# Patient Record
Sex: Female | Born: 2005 | Race: Black or African American | Hispanic: No | Marital: Single
Health system: Southern US, Community
[De-identification: ages and names within clinical notes are randomized; demographics above are authoritative.]

## PROBLEM LIST (undated history)

## (undated) DIAGNOSIS — Z789 Other specified health status: Secondary | ICD-10-CM

## (undated) DIAGNOSIS — D649 Anemia, unspecified: Secondary | ICD-10-CM

## (undated) HISTORY — DX: Other specified health status: Z78.9

## (undated) HISTORY — PX: NO PAST SURGERIES: SHX2092

## (undated) HISTORY — DX: Anemia, unspecified: D64.9

---
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
## 2023-01-25 ENCOUNTER — Ambulatory Visit (HOSPITAL_COMMUNITY)
Admission: EM | Admit: 2023-01-25 | Discharge: 2023-01-25 | Disposition: A | Discharge status: Home / Self Care | Payer: Medicaid Other | Attending: Family Medicine | Admitting: Family Medicine

## 2023-01-25 ENCOUNTER — Other Ambulatory Visit: Payer: Self-pay

## 2023-01-25 ENCOUNTER — Encounter (HOSPITAL_COMMUNITY): Payer: Self-pay | Admitting: *Deleted

## 2023-01-25 DIAGNOSIS — Z7722 Contact with and (suspected) exposure to environmental tobacco smoke (acute) (chronic): Secondary | ICD-10-CM | POA: Diagnosis not present

## 2023-01-25 DIAGNOSIS — B3731 Acute candidiasis of vulva and vagina: Secondary | ICD-10-CM | POA: Diagnosis not present

## 2023-01-25 DIAGNOSIS — Z3A00 Weeks of gestation of pregnancy not specified: Secondary | ICD-10-CM | POA: Diagnosis not present

## 2023-01-25 DIAGNOSIS — N898 Other specified noninflammatory disorders of vagina: Secondary | ICD-10-CM | POA: Diagnosis not present

## 2023-01-25 DIAGNOSIS — N390 Urinary tract infection, site not specified: Secondary | ICD-10-CM | POA: Insufficient documentation

## 2023-01-25 DIAGNOSIS — O23599 Infection of other part of genital tract in pregnancy, unspecified trimester: Secondary | ICD-10-CM | POA: Diagnosis not present

## 2023-01-25 DIAGNOSIS — Z3490 Encounter for supervision of normal pregnancy, unspecified, unspecified trimester: Secondary | ICD-10-CM | POA: Insufficient documentation

## 2023-01-25 DIAGNOSIS — Z3201 Encounter for pregnancy test, result positive: Secondary | ICD-10-CM | POA: Diagnosis not present

## 2023-01-25 DIAGNOSIS — O98819 Other maternal infectious and parasitic diseases complicating pregnancy, unspecified trimester: Secondary | ICD-10-CM | POA: Diagnosis not present

## 2023-01-25 DIAGNOSIS — O2340 Unspecified infection of urinary tract in pregnancy, unspecified trimester: Secondary | ICD-10-CM | POA: Insufficient documentation

## 2023-01-25 DIAGNOSIS — Z3A15 15 weeks gestation of pregnancy: Secondary | ICD-10-CM | POA: Diagnosis not present

## 2023-01-25 LAB — POCT URINALYSIS DIP (MANUAL ENTRY)
Bilirubin, UA: NEGATIVE
Glucose, UA: NEGATIVE mg/dL
Ketones, POC UA: NEGATIVE mg/dL
Nitrite, UA: POSITIVE — AB
Protein Ur, POC: 30 mg/dL — AB
Spec Grav, UA: 1.025 (ref 1.010–1.025)
Urobilinogen, UA: 2 U/dL — AB
pH, UA: 6 (ref 5.0–8.0)

## 2023-01-25 LAB — POCT URINE PREGNANCY: Preg Test, Ur: POSITIVE — AB

## 2023-01-25 MED ORDER — CEPHALEXIN 500 MG PO CAPS: 500.0000 mg | ORAL_CAPSULE | Freq: Three times a day (TID) | ORAL | 0 refills | Status: AC

## 2023-01-25 MED ORDER — MICONAZOLE NITRATE 2 % VA CREA: 1.0000 | TOPICAL_CREAM | Freq: Every day | VAGINAL | 0 refills | Status: DC

## 2023-01-25 MED ORDER — FLUCONAZOLE 150 MG PO TABS: ORAL_TABLET | ORAL | 0 refills | Status: DC

## 2023-01-25 NOTE — ED Triage Notes (Signed)
Pt reports Vag discharge that is white and chunky and vag irritation for 3 days.

## 2023-01-25 NOTE — ED Provider Notes (Addendum)
[facility]    CSN: [id number] Arrival date & time: 01/25/23  1236      History   Chief Complaint Chief Complaint  Patient presents with   Vaginal Discharge   Vaginitis    HPI Tiffany Kline is a 17 y.o. female.    Vaginal Discharge  Patient is here for white, chunky vaginal d/c.  Also itchy.  Going on x 3 days.  No known std exposures.  No urinary symptoms noted.  Her LMP was in July.  She did have a home positive preg test.  She has not seen an ob/gyn.        History reviewed. No pertinent past medical history.  There are no problems to display for this patient.   History reviewed. No pertinent surgical history.  OB History   No obstetric history on file.      Home Medications    Prior to Admission medications   Medication Sig Start Date End Date Taking? Authorizing Provider  Cetirizine HCl (ZYRTEC) 5 MG/5ML SYRP Take 5 mg by mouth daily as needed. For allergies   Yes [provider]  fluticasone (FLONASE) 50 MCG/ACT nasal spray Place 2 sprays into the nose daily as needed. For dry nose   Yes [provider]  triamcinolone cream (KENALOG) 0.1 % Apply 1 application topically daily as needed. For bug bites   Yes [provider]  ferrous sulfate 325 (65 FE) MG tablet Take 1 tablet (325 mg total) by mouth daily. 06/26/22   Jacalyn Lefevre, MD  lidocaine (XYLOCAINE) 2 % solution Use as directed 15 mLs in the mouth or throat as needed for mouth pain. 12/08/20   Rhys Martini, PA-C  trimethoprim-polymyxin b (POLYTRIM) ophthalmic solution Place 2 drops into the right eye every 4 (four) hours. 06/26/22   Jacalyn Lefevre, MD    Family History History reviewed. No pertinent family history.  Social History Social History   Tobacco Use   Smoking status: Passive Smoke Exposure - Never Smoker     Allergies   Shellfish allergy and Fish allergy   Review of Systems Review of Systems  Constitutional: Negative.   HENT:  Negative.    Respiratory: Negative.    Cardiovascular: Negative.   Gastrointestinal: Negative.   Genitourinary:  Positive for vaginal discharge.     Physical Exam Triage Vital Signs ED Triage Vitals  Encounter Vitals Group     BP 01/25/23 1325 96/62     Systolic BP Percentile --      Diastolic BP Percentile --      Pulse Rate 01/25/23 1325 91     Resp 01/25/23 1325 18     Temp 01/25/23 1325 98.2 F (36.8 C)     Temp src --      SpO2 01/25/23 1325 99 %     Weight --      Height --      Head Circumference --      Peak Flow --      Pain Score 01/25/23 1322 7     Pain Loc --      Pain Education --      Exclude from Growth Chart --    No data found.  Updated Vital Signs BP 96/62   Pulse 91   Temp 98.2 F (36.8 C)   Resp 18   LMP  (LMP Unknown)   SpO2 99%   Visual Acuity Right Eye Distance:   Left Eye Distance:   Bilateral Distance:  Right Eye Near:   Left Eye Near:    Bilateral Near:     Physical Exam Constitutional:      Appearance: Normal appearance.  Cardiovascular:     Rate and Rhythm: Normal rate and regular rhythm.  Pulmonary:     Effort: Pulmonary effort is normal.  Neurological:     General: No focal deficit present.     Mental Status: She is alert.  Psychiatric:        Mood and Affect: Mood normal.      UC Treatments / Results  Labs (all labs ordered are listed, but only abnormal results are displayed) Labs Reviewed  POCT URINALYSIS DIP (MANUAL ENTRY) - Abnormal; Notable for the following components:      Result Value   Blood, UA small (*)    Protein Ur, POC =30 (*)    Urobilinogen, UA 2.0 (*)    Nitrite, UA Positive (*)    Leukocytes, UA Large (3+) (*)    All other components within normal limits  POCT URINE PREGNANCY - Abnormal; Notable for the following components:   Preg Test, Ur Positive (*)    All other components within normal limits  CERVICOVAGINAL ANCILLARY ONLY   UPT positive  EKG   Radiology No results  found.  Procedures Procedures (including critical care time)  Medications Ordered in UC Medications - No data to display  Initial Impression / Assessment and Plan / UC Course  I have reviewed the triage vital signs and the nursing notes.  Pertinent labs & imaging results that were available during my care of the patient were reviewed by me and considered in my medical decision making (see chart for details).  Patient is here for vaginal discharge.  Of note, she is also pregnant.  States LMP was July, which could put her about [redacted] weeks pregnant.  As a result, will treat with topicals for now.  She is aware to follow up with ob/gyn.   Final Clinical Impressions(s) / UC Diagnoses   Final diagnoses:  Vaginal yeast infection  Vaginal discharge  Pregnancy, unspecified gestational age  Urinary tract infection without hematuria, site unspecified     Discharge Instructions      You were seen today for vaginal symptoms.  I have sent out a cream to help treat possible yeast infection.  The vaginal swab will be resulted tomorrow and if there is anything else to be treated you will be notified.  You will also see your results in mychart.  Your urine does appear to show infection.  I have sent this for culture but also sent out an antibiotic for you.  Please make an appointment with an ob/gyn.  You may call [phone] to make an appointment.     ED Prescriptions     Medication Sig Dispense Auth. Provider   fluconazole (DIFLUCAN) 150 MG tablet  (Status: Discontinued) 1 tab po x 1, repeat in 3 days 2 tablet Dot Splinter, MD   cephALEXin (KEFLEX) 500 MG capsule Take 1 capsule (500 mg total) by mouth 3 (three) times daily for 7 days. 21 capsule Aniyah Nobis, MD   miconazole (MICONAZOLE 7) 2 % vaginal cream Place 1 Applicatorful vaginally at bedtime. 45 g Jannifer Franklin, MD      PDMP not reviewed this encounter.   Jannifer Franklin, MD 01/25/23 1406    Jannifer Franklin, MD 02/02/23 [phone]

## 2023-01-25 NOTE — Discharge Instructions (Addendum)
You were seen today for vaginal symptoms.  I have sent out a cream to help treat possible yeast infection.  The vaginal swab will be resulted tomorrow and if there is anything else to be treated you will be notified.  You will also see your results in mychart.  Your urine does appear to show infection.  I have sent this for culture but also sent out an antibiotic for you.  Please make an appointment with an ob/gyn.  You may call [phone] to make an appointment.

## 2023-01-25 NOTE — ED Triage Notes (Signed)
Last period July 2024

## 2023-01-26 LAB — CERVICOVAGINAL ANCILLARY ONLY
Bacterial Vaginitis (gardnerella): NEGATIVE
Candida Glabrata: NEGATIVE
Candida Vaginitis: POSITIVE — AB
Chlamydia: POSITIVE — AB
Comment: NEGATIVE
Comment: NEGATIVE
Comment: NEGATIVE
Comment: NEGATIVE
Comment: NEGATIVE
Comment: NORMAL
Neisseria Gonorrhea: POSITIVE — AB
Trichomonas: NEGATIVE

## 2023-01-27 LAB — URINE CULTURE: Culture: 80000 — AB

## 2023-01-30 DIAGNOSIS — Z419 Encounter for procedure for purposes other than remedying health state, unspecified: Secondary | ICD-10-CM | POA: Diagnosis not present

## 2023-03-01 DIAGNOSIS — Z419 Encounter for procedure for purposes other than remedying health state, unspecified: Secondary | ICD-10-CM | POA: Diagnosis not present

## 2023-04-01 DIAGNOSIS — Z419 Encounter for procedure for purposes other than remedying health state, unspecified: Secondary | ICD-10-CM | POA: Diagnosis not present

## 2023-04-01 NOTE — L&D Delivery Note (Signed)
 [facility]N Faculty Practice Delivery Note  Tiffany Kline is a 18 y.o. G1P0000 s/p NVSB at [redacted]w[redacted]d. She was admitted for PPROM.   ROM: 11h 49m with clear fluid GBS Status: Unknown, tx Maximum Maternal Temperature: 98.5  Labor Progress: SROM at home with AROM of forebag here with progression to complete  Delivery Date/Time: 06/01/23 @ 1741 Delivery: Called to room and patient was complete and pushing. Head delivered OA to LOA. No nuchal cord present. Shoulder and body delivered in usual fashion. Infant with poor tone placed on mother's abdomen, dried and stimulated. Cord clamped x 2 and cut by CNM, left cord long for later trim by FOB. Infant taken to warmer with NICU team. Cord blood drawn. Placenta delivered spontaneously, intact, with 3-vessel cord. Fundus firm with massage and Pitocin. Labia, perineum, vagina, and cervix inspected, bilateral labial and 1st degree vaginal laceration found and repaired with 3.0 vicryl, with excellent approximation and hemostasis noted.   Placenta: spontaneous, intact, Duncan to Path Complications: preterm delivery to NICU Lacerations: bilateral labial and 1st degree vaginal, right labial with repair only as above, left labial hemostatic EBL: 222 Analgesia: epidural  Postpartum Planning Arly.Keller ] transfer orders to MB [ X] discharge summary started & shared Arly.Keller ] message to sent to schedule follow-up  Arly.Keller ] lists updated  Infant: boy Andi Hence 6/8  2110 g  Lamont Snowball, MSN, CNM, RNC-OB Certified Nurse Midwife, [facility] 06/01/2023 7:58 PM

## 2023-04-12 DIAGNOSIS — Z419 Encounter for procedure for purposes other than remedying health state, unspecified: Secondary | ICD-10-CM | POA: Diagnosis not present

## 2023-04-13 ENCOUNTER — Telehealth (INDEPENDENT_AMBULATORY_CARE_PROVIDER_SITE_OTHER): Payer: Medicaid Other

## 2023-04-13 DIAGNOSIS — O0930 Supervision of pregnancy with insufficient antenatal care, unspecified trimester: Secondary | ICD-10-CM | POA: Insufficient documentation

## 2023-04-13 DIAGNOSIS — O0933 Supervision of pregnancy with insufficient antenatal care, third trimester: Secondary | ICD-10-CM

## 2023-04-13 DIAGNOSIS — Z3A28 28 weeks gestation of pregnancy: Secondary | ICD-10-CM | POA: Diagnosis not present

## 2023-04-13 DIAGNOSIS — Z3490 Encounter for supervision of normal pregnancy, unspecified, unspecified trimester: Secondary | ICD-10-CM | POA: Insufficient documentation

## 2023-04-13 MED ORDER — GOJJI WEIGHT SCALE MISC: 1.0000 | 0 refills | Status: AC | PRN

## 2023-04-13 MED ORDER — BLOOD PRESSURE KIT DEVI: 1.0000 | 0 refills | Status: AC | PRN

## 2023-04-13 NOTE — Patient Instructions (Addendum)
 Please pick up blood pressure and weight scale from  [facility]  [address]  [place], [place] [address] [phone]   Safe Medications in Pregnancy   Acne:  Benzoyl Peroxide  Salicylic Acid   Backache/Headache:  Tylenol : 2 regular strength every 4 hours OR               2 Extra strength every 6 hours   Colds/Coughs/Allergies:  Benadryl  (alcohol free) 25 mg every 6 hours as needed  Breath right strips  Claritin  Cepacol throat lozenges  Chloraseptic throat spray  Cold-Eeze- up to three times per day  Cough drops, alcohol free  Flonase (by prescription only)  Guaifenesin  Mucinex  Robitussin DM (plain only, alcohol free)  Saline nasal spray/drops  Sudafed (pseudoephedrine) & Actifed * use only after [redacted] weeks gestation and if you do not have high blood pressure  Tylenol   Vicks Vaporub  Zinc lozenges  Zyrtec   Constipation:  Colace  Ducolax suppositories  Fleet enema  Glycerin  suppositories  Metamucil  Milk of magnesia  Miralax  Senokot  Smooth move tea   Diarrhea:  Kaopectate  Imodium A-D   *NO pepto Bismol   Hemorrhoids:  Anusol  Anusol HC  Preparation H  Tucks   Indigestion:  Tums  Maalox  Mylanta  Zantac  Pepcid   Insomnia:  Benadryl  (alcohol free) 25mg  every 6 hours as needed  Tylenol  PM  Unisom, no Gelcaps   Leg Cramps:  Tums  MagGel   Nausea/Vomiting:  Bonine  Dramamine  Emetrol  Ginger extract  Sea bands  Meclizine  Nausea medication to take during pregnancy:  Unisom (doxylamine succinate 25 mg tablets) Take one tablet daily at bedtime. If symptoms are not adequately controlled, the dose can be increased to a maximum recommended dose of two tablets daily (1/2 tablet in the morning, 1/2 tablet mid-afternoon and one at bedtime).  Vitamin B6 100mg  tablets. Take one tablet twice a day (up to 200 mg per day).   Skin Rashes:  Aveeno products  Benadryl  cream or 25mg  every 6 hours as needed  Calamine Lotion  1% cortisone  cream   Yeast infection:  Gyne-lotrimin 7  Monistat  7    **If taking multiple medications, please check labels to avoid duplicating the same active ingredients  **take medication as directed on the label  ** Do not exceed 4000 mg of tylenol  in 24 hours  **Do not take medications that contain aspirin or ibuprofen 

## 2023-04-13 NOTE — Progress Notes (Signed)
 New OB Intake  I connected with Tiffany Kline   on 04/13/23 at  3:15 PM EST by MyChart Video Visit and verified that I am speaking with the correct person using two identifiers. Nurse is located at [facility] and pt is located at home.  I discussed the limitations, risks, security and privacy concerns of performing an evaluation and management service by telephone and the availability of in person appointments. I also discussed with the patient that there may be a patient responsible charge related to this service. The patient expressed understanding and agreed to proceed.  I explained I am completing New OB Intake today. We discussed EDD of 07/06/2023 based on LMP. Pt is G1P0. I reviewed her allergies, medications and Medical/Surgical/OB history.    Patient Active Problem List   Diagnosis Date Noted   Supervision of low-risk pregnancy 04/13/2023   Late prenatal care 04/13/2023    Concerns addressed today  No concerns today  Delivery Plans Plans to deliver at [facility]. Discussed the nature of our practice with multiple providers including residents and students. Due to the size of the practice, the delivering provider may not be the same as those providing prenatal care.   Patient is not interested in water birth. Offered upcoming OB visit with CNM to discuss further.  MyChart/Babyscripts MyChart access verified. I explained pt will have some visits in office and some virtually. Babyscripts instructions given and order placed. Patient verifies receipt of registration text/e-mail. Account successfully created and app downloaded. If patient is a candidate for Optimized scheduling, add to sticky note.   Blood Pressure Cuff/Weight Scale Blood pressure cuff ordered for patient to pick-up from [facility]. Explained after first prenatal appt pt will check weekly and document in Babyscripts. Patient does not have weight scale; order sent to [facility], patient may track weight weekly in  Babyscripts.  Anatomy US  Explained first scheduled US  will be around 19 weeks. Anatomy US  scheduled for January 31 at 1415.  Is patient a CenteringPregnancy candidate?  Is thinking about joining  and will let us  know at her NEW OB provider appt.    Is patient a Mom+Baby Combined Care candidate?  Not a candidate - she is late to care.    Interested in [place]? If yes, send referral and doula dot phrase.   Is patient a candidate for Babyscripts Optimization? Yes, patient declined   First visit review I reviewed new OB appt with patient. Explained pt will be seen by Izell, MD at first visit. Discussed [place] genetic screening with patient. Pt agrees to  [employer] and [employer].. Routine prenatal labs along with 2 hr gtt  will be collected  at her NEW OB visit on 04/20/23.  Pt aware to have nothing by mouth for glucose testing.   Orders placed as future order.   Last Pap Patient is not of age for pap smear and will get GC/CH vaginal swab at NEW OB provider appt.   Sybrina Laning, RN 04/13/2023  3:31 PM

## 2023-04-15 DIAGNOSIS — Z3490 Encounter for supervision of normal pregnancy, unspecified, unspecified trimester: Secondary | ICD-10-CM | POA: Diagnosis not present

## 2023-04-20 ENCOUNTER — Ambulatory Visit: Payer: Medicaid Other | Admitting: Obstetrics and Gynecology

## 2023-04-20 ENCOUNTER — Other Ambulatory Visit (HOSPITAL_COMMUNITY)
Admission: RE | Admit: 2023-04-20 | Discharge: 2023-04-20 | Disposition: A | Discharge status: Home / Self Care | Payer: Medicaid Other | Source: Ambulatory Visit | Attending: Obstetrics and Gynecology | Admitting: Obstetrics and Gynecology

## 2023-04-20 ENCOUNTER — Encounter: Payer: Self-pay | Admitting: Obstetrics and Gynecology

## 2023-04-20 ENCOUNTER — Other Ambulatory Visit: Payer: Self-pay

## 2023-04-20 ENCOUNTER — Other Ambulatory Visit: Payer: Medicaid Other

## 2023-04-20 VITALS — BP 112/72 | HR 103 | Wt 120.0 lb

## 2023-04-20 DIAGNOSIS — Z3482 Encounter for supervision of other normal pregnancy, second trimester: Secondary | ICD-10-CM | POA: Diagnosis not present

## 2023-04-20 DIAGNOSIS — Z3490 Encounter for supervision of normal pregnancy, unspecified, unspecified trimester: Secondary | ICD-10-CM

## 2023-04-20 DIAGNOSIS — A749 Chlamydial infection, unspecified: Secondary | ICD-10-CM | POA: Diagnosis not present

## 2023-04-20 DIAGNOSIS — Z3A29 29 weeks gestation of pregnancy: Secondary | ICD-10-CM | POA: Diagnosis not present

## 2023-04-20 DIAGNOSIS — Z23 Encounter for immunization: Secondary | ICD-10-CM

## 2023-04-20 DIAGNOSIS — O0930 Supervision of pregnancy with insufficient antenatal care, unspecified trimester: Secondary | ICD-10-CM

## 2023-04-20 DIAGNOSIS — O0933 Supervision of pregnancy with insufficient antenatal care, third trimester: Secondary | ICD-10-CM | POA: Diagnosis not present

## 2023-04-20 DIAGNOSIS — O98813 Other maternal infectious and parasitic diseases complicating pregnancy, third trimester: Secondary | ICD-10-CM | POA: Diagnosis not present

## 2023-04-20 MED ORDER — PRENATAL PLUS VITAMIN/MINERAL 27-1 MG PO TABS: 1.0000 | ORAL_TABLET | Freq: Every day | ORAL | 11 refills | Status: AC

## 2023-04-20 NOTE — Progress Notes (Addendum)
New OB Note  04/20/2023   Clinic: [facility]  Chief Complaint: new OB  Transfer of Care Patient: no  History of Present Illness: Tiffany Kline is a 18 y.o. G1P0000 at 29/0 weeks [facility] 4/7 [tenative], based on Patient's last menstrual period was 09/29/2022.).  Preg complicated by has Supervision of low-risk pregnancy and Late prenatal care on their problem list.   No OB s/s.   ROS: A 12-point review of systems was performed and negative, except as stated in the above HPI.  OBGYN History: As per HPI. OB History  Gravida Para Term Preterm AB Living  1 0 0 0 0 0  SAB IAB Ectopic Multiple Live Births  0 0 0 0 0    # Outcome Date GA Lbr Len/2nd Weight Sex Type Anes PTL Lv  1 Current            History of pap smears: No.    Past Medical History: Past Medical History:  Diagnosis Date   No pertinent past medical history     Past Surgical History: No past surgical history on file.  Family History:  Family History  Problem Relation Age of Onset   Kidney failure Mother    Social History:  Social History   Socioeconomic History   Marital status: Single    Spouse name: Not on file   Number of children: Not on file   Years of education: Not on file   Highest education level: Not on file  Occupational History   Not on file  Tobacco Use   Smoking status: Never    Passive exposure: Yes   Smokeless tobacco: Not on file  Vaping Use   Vaping status: Former   Quit date: 11/30/2022  Substance and Sexual Activity   Alcohol use: Never   Drug use: Never   Sexual activity: Yes    Birth control/protection: None  Other Topics Concern   Not on file  Social History Narrative   Not on file   Social Drivers of Health   Financial Resource Strain: Not on file  Food Insecurity: Not on file  Transportation Needs: Not on file  Physical Activity: Not on file  Stress: Not on file  Social Connections: Not on file  Intimate Partner Violence: Not  on file    Allergy: Allergies  Allergen Reactions   Shellfish Allergy Anaphylaxis   Fish Allergy Hives and Rash    "shrimp"   Current Outpatient Medications: Prenatal vitamin  Physical Exam:   BP 112/72   Pulse (!) 103   Wt 120 lb (54.4 kg)   LMP 09/29/2022   BMI 19.37 kg/m  Body mass index is 19.37 kg/m. Contractions: Not present Vag. Bleeding: None. Fundal height: 26 FHTs: 140s  General appearance: Well nourished, well developed female in no acute distress.  Neck:  Supple, normal appearance, and no thyromegaly  Cardiovascular: S1, S2 normal, no murmur, rub or gallop, regular rate and rhythm Respiratory:  Clear to auscultation bilateral. Normal respiratory effort Abdomen: gravid, nttp Neuro/Psych:  Normal mood and affect.  Skin:  Warm and dry.   Laboratory: none  Imaging:  none  Assessment: patient stable  Plan: 1. Encounter for supervision of low-risk pregnancy, antepartum (Primary) Patient states her periods were qmonth, regular and she wasn't on anything for contraception when she conceived. She also denies any u/s anywhere this pregnancy. Confirm EDC after u/s. FH borderline low so will see if can move up anatomy/dating u/s from 1/31. D/w her  re: total weight gain goals [facility] precautions given.  New OB labs, panorama, horizon, GTT today - GC/Chlamydia probe amp ([place])not at [facility] - Flu vaccine trivalent PF, 6mos and older(Flulaval,Afluria,Fluarix,Fluzone)  2. Encounter for supervision of other normal pregnancy in second trimester - HORIZON Basic Panel - PANORAMA PRENATAL TEST  3. Late prenatal care Pregnancy navigator not here today; follow up next visit  4. [redacted] weeks gestation of pregnancy  Problem list reviewed and updated.  Follow up in 2 weeks.  >50% of 35 min visit spent on counseling and coordination of care.  Return in 11 days (on 05/01/2023) for md or app, low risk ob, in person.  Future Appointments  Date Time Provider Department  Center  05/01/2023  2:15 PM [facility] NURSE [facility]  05/01/2023  2:30 PM [facility] US2 [facility]  05/01/2023  3:30 PM [facility] PROVIDER 1 [facility]    Cornelia Copa MD Attending Center for [facility])

## 2023-04-20 NOTE — Patient Instructions (Signed)

## 2023-04-21 LAB — CBC/D/PLT+RPR+RH+ABO+RUBIGG...
Antibody Screen: NEGATIVE
Basophils Absolute: 0.1 10*3/uL (ref 0.0–0.2)
Basos: 1 %
EOS (ABSOLUTE): 0.1 10*3/uL (ref 0.0–0.4)
Eos: 1 %
HCV Ab: NONREACTIVE
HIV Screen 4th Generation wRfx: NONREACTIVE
Hematocrit: 26.8 % — ABNORMAL LOW (ref 34.0–46.6)
Hemoglobin: 8.5 g/dL — ABNORMAL LOW (ref 11.1–15.9)
Hepatitis B Surface Ag: NEGATIVE
Immature Grans (Abs): 0.1 10*3/uL (ref 0.0–0.1)
Immature Granulocytes: 1 %
Lymphocytes Absolute: 2.1 10*3/uL (ref 0.7–3.1)
Lymphs: 20 %
MCH: 25.2 pg — ABNORMAL LOW (ref 26.6–33.0)
MCHC: 31.7 g/dL (ref 31.5–35.7)
MCV: 80 fL (ref 79–97)
Monocytes Absolute: 0.9 10*3/uL (ref 0.1–0.9)
Monocytes: 9 %
Neutrophils Absolute: 7.4 10*3/uL — ABNORMAL HIGH (ref 1.4–7.0)
Neutrophils: 68 %
Platelets: 212 10*3/uL (ref 150–450)
RBC: 3.37 x10E6/uL — ABNORMAL LOW (ref 3.77–5.28)
RDW: 13.4 % (ref 11.7–15.4)
RPR Ser Ql: NONREACTIVE
Rh Factor: POSITIVE
Rubella Antibodies, IGG: 2.75 {index} (ref >0.99)
WBC: 10.6 10*3/uL (ref 3.4–10.8)

## 2023-04-21 LAB — GC/CHLAMYDIA PROBE AMP (~~LOC~~) NOT AT ARMC
Chlamydia: POSITIVE — AB
Comment: NEGATIVE
Comment: NORMAL
Neisseria Gonorrhea: NEGATIVE

## 2023-04-21 LAB — GLUCOSE TOLERANCE, 2 HOURS W/ 1HR
Glucose, 1 hour: 140 mg/dL (ref 70–179)
Glucose, 2 hour: 100 mg/dL (ref 70–152)
Glucose, Fasting: 79 mg/dL (ref 70–91)

## 2023-04-21 LAB — HEMOGLOBIN A1C
Est. average glucose Bld gHb Est-mCnc: 100 mg/dL
Hgb A1c MFr Bld: 5.1 % (ref 4.8–5.6)

## 2023-04-21 LAB — HCV INTERPRETATION

## 2023-04-22 ENCOUNTER — Encounter: Payer: Self-pay | Admitting: Obstetrics and Gynecology

## 2023-04-22 ENCOUNTER — Other Ambulatory Visit: Payer: Self-pay | Admitting: Obstetrics and Gynecology

## 2023-04-22 ENCOUNTER — Telehealth: Payer: Self-pay | Admitting: Pharmacy Technician

## 2023-04-22 DIAGNOSIS — A749 Chlamydial infection, unspecified: Secondary | ICD-10-CM | POA: Insufficient documentation

## 2023-04-22 DIAGNOSIS — O99019 Anemia complicating pregnancy, unspecified trimester: Secondary | ICD-10-CM | POA: Insufficient documentation

## 2023-04-22 DIAGNOSIS — Z3493 Encounter for supervision of normal pregnancy, unspecified, third trimester: Secondary | ICD-10-CM

## 2023-04-22 MED ORDER — AZITHROMYCIN 500 MG PO TABS: 1000.0000 mg | ORAL_TABLET | Freq: Once | ORAL | 0 refills | Status: AC

## 2023-04-22 NOTE — Addendum Note (Signed)
Addended by: [place] Bing on: 04/22/2023 10:52 AM   Modules accepted: Orders

## 2023-04-22 NOTE — Telephone Encounter (Signed)
Dr. Vergie Living, Patient will be scheduled as soon as possible.  Auth Submission: NO AUTH NEEDED Site of care: Site of care: [facility] Payer: [facility] Medication & CPT/J Code(s) submitted: Venofer (Iron Sucrose) J1756 Route of submission (phone, fax, portal):  Phone # Fax # Auth type: Buy/Bill PB Units/visits requested: 2 DOSES Reference number:  Approval from: 04/22/23 to 09/20/23

## 2023-04-23 ENCOUNTER — Encounter: Payer: Self-pay | Admitting: *Deleted

## 2023-04-23 LAB — URINE CULTURE, OB REFLEX

## 2023-04-23 LAB — CULTURE, OB URINE

## 2023-04-24 ENCOUNTER — Other Ambulatory Visit: Payer: Self-pay | Admitting: Obstetrics and Gynecology

## 2023-04-24 ENCOUNTER — Encounter: Payer: Self-pay | Admitting: Obstetrics and Gynecology

## 2023-04-24 DIAGNOSIS — O2343 Unspecified infection of urinary tract in pregnancy, third trimester: Secondary | ICD-10-CM | POA: Insufficient documentation

## 2023-04-24 MED ORDER — SULFAMETHOXAZOLE-TRIMETHOPRIM 800-160 MG PO TABS: 1.0000 | ORAL_TABLET | Freq: Two times a day (BID) | ORAL | 0 refills | Status: AC

## 2023-04-25 LAB — PANORAMA PRENATAL TEST FULL PANEL:PANORAMA TEST PLUS 5 ADDITIONAL MICRODELETIONS: FETAL FRACTION: 16.8

## 2023-04-29 LAB — HORIZON CUSTOM: REPORT SUMMARY: NEGATIVE

## 2023-05-01 ENCOUNTER — Ambulatory Visit (INDEPENDENT_AMBULATORY_CARE_PROVIDER_SITE_OTHER): Payer: Medicaid Other | Admitting: Obstetrics and Gynecology

## 2023-05-01 ENCOUNTER — Ambulatory Visit (HOSPITAL_BASED_OUTPATIENT_CLINIC_OR_DEPARTMENT_OTHER): Payer: Medicaid Other | Admitting: Maternal & Fetal Medicine

## 2023-05-01 ENCOUNTER — Ambulatory Visit: Payer: Medicaid Other | Admitting: *Deleted

## 2023-05-01 ENCOUNTER — Other Ambulatory Visit: Payer: Self-pay | Admitting: *Deleted

## 2023-05-01 ENCOUNTER — Ambulatory Visit: Payer: Medicaid Other | Attending: Obstetrics and Gynecology

## 2023-05-01 ENCOUNTER — Other Ambulatory Visit: Payer: Self-pay

## 2023-05-01 VITALS — BP 116/61 | HR 84

## 2023-05-01 VITALS — BP 116/75 | HR 85 | Wt 121.3 lb

## 2023-05-01 DIAGNOSIS — O0933 Supervision of pregnancy with insufficient antenatal care, third trimester: Secondary | ICD-10-CM

## 2023-05-01 DIAGNOSIS — O403XX0 Polyhydramnios, third trimester, not applicable or unspecified: Secondary | ICD-10-CM

## 2023-05-01 DIAGNOSIS — O98813 Other maternal infectious and parasitic diseases complicating pregnancy, third trimester: Secondary | ICD-10-CM

## 2023-05-01 DIAGNOSIS — O0930 Supervision of pregnancy with insufficient antenatal care, unspecified trimester: Secondary | ICD-10-CM

## 2023-05-01 DIAGNOSIS — O99013 Anemia complicating pregnancy, third trimester: Secondary | ICD-10-CM | POA: Insufficient documentation

## 2023-05-01 DIAGNOSIS — O2343 Unspecified infection of urinary tract in pregnancy, third trimester: Secondary | ICD-10-CM | POA: Diagnosis not present

## 2023-05-01 DIAGNOSIS — O409XX0 Polyhydramnios, unspecified trimester, not applicable or unspecified: Secondary | ICD-10-CM

## 2023-05-01 DIAGNOSIS — Z363 Encounter for antenatal screening for malformations: Secondary | ICD-10-CM | POA: Insufficient documentation

## 2023-05-01 DIAGNOSIS — A749 Chlamydial infection, unspecified: Secondary | ICD-10-CM

## 2023-05-01 DIAGNOSIS — Z3A30 30 weeks gestation of pregnancy: Secondary | ICD-10-CM | POA: Insufficient documentation

## 2023-05-01 DIAGNOSIS — D649 Anemia, unspecified: Secondary | ICD-10-CM | POA: Diagnosis not present

## 2023-05-01 DIAGNOSIS — Z3490 Encounter for supervision of normal pregnancy, unspecified, unspecified trimester: Secondary | ICD-10-CM | POA: Diagnosis present

## 2023-05-01 DIAGNOSIS — Z3493 Encounter for supervision of normal pregnancy, unspecified, third trimester: Secondary | ICD-10-CM

## 2023-05-01 NOTE — Progress Notes (Signed)
   PRENATAL VISIT NOTE  Subjective:  Tiffany Kline is a 18 y.o. G1P0000 at [redacted]w[redacted]d being seen today for ongoing prenatal care.  She is currently monitored for the following issues for this low-risk pregnancy and has Supervision of low-risk pregnancy; Late prenatal care; Chlamydia infection affecting pregnancy in third trimester; Anemia in pregnancy; and UTI in pregnancy, antepartum, third trimester on their problem list.  Patient reports no complaints.  Contractions: Not present. Vag. Bleeding: None.  Movement: Present. Denies leaking of fluid.   The following portions of the patient's history were reviewed and updated as appropriate: allergies, current medications, past family history, past medical history, past social history, past surgical history and problem list.   Objective:   Vitals:   05/01/23 1001  BP: 116/75  Pulse: 85  Weight: 121 lb 4.8 oz (55 kg)    Fetal Status: Fetal Heart Rate (bpm): 147 Fundal Height: 28 cm Movement: Present     General:  Alert, oriented and cooperative. Patient is in no acute distress.  Skin: Skin is warm and dry. No rash noted.   Cardiovascular: Normal heart rate noted  Respiratory: Normal respiratory effort, no problems with respiration noted  Abdomen: Soft, gravid, appropriate for gestational age.  Pain/Pressure: Present     Pelvic: Cervical exam deferred        Extremities: Normal range of motion.  Edema: None  Mental Status: Normal mood and affect. Normal behavior. Normal judgment and thought content.   Assessment and Plan:  Pregnancy: G1P0000 at [redacted]w[redacted]d 1. Encounter for supervision of low-risk pregnancy in third trimester (Primary) BP and FHR normal Doing well, feeling regular movement  FH appropriate  2. Late prenatal care   3. Chlamydia infection affecting pregnancy in third trimester S/p azithro, TOC late feb  4. [redacted] weeks gestation of pregnancy Anatomy scan today  5. UTI in pregnancy, antepartum, third trimester S/p bactrim,  TOC later   6. Anemia during pregnancy in third trimester IV iron orders placed previously, she is going to call to schedule appt    Preterm labor symptoms and general obstetric precautions including but not limited to vaginal bleeding, contractions, leaking of fluid and fetal movement were reviewed in detail with the patient. Please refer to After Visit Summary for other counseling recommendations.   Return in about 2 weeks (around 05/15/2023) for OB VISIT (MD or APP).  Future Appointments  Date Time Provider Department Center  05/01/2023  2:15 PM [facility] NURSE [facility]  05/01/2023  2:30 PM [facility] US2 [facility]  05/01/2023  3:30 PM [facility] PROVIDER 1 [facility]    Albertine Grates, FNP

## 2023-05-01 NOTE — Progress Notes (Signed)
Patient information  Patient Name: Tiffany Kline  Patient MRN:   [id number]  Referring practice: MFM Referring Provider: [facility])  MFM CONSULT  Tiffany Kline is a 18 y.o. G1P0000 at [redacted]w[redacted]d here for ultrasound and consultation. Patient Active Problem List   Diagnosis Date Noted   UTI in pregnancy, antepartum, third trimester 04/24/2023   Chlamydia infection affecting pregnancy in third trimester 04/22/2023   Anemia in pregnancy 04/22/2023   Supervision of low-risk pregnancy 04/13/2023   Late prenatal care 04/13/2023    Tiffany Kline is doing well today with no acute concerns. She denies contractions, bleeding, or loss of fluid and reports good fetal movement.   RE new EDD: LMP was uncertain. Wil change to EDD of 07/10/23.   RE polyhydramnios:  Polyhydramnios was seen on today's ultrasound based on the MVP of 15 cm. There cardiac anatomy appears normal. There is no evidence of chorioangioma. The fetal movement is normal without evidence of arthrogryposis. The fetal stomach and bladder appears normal. The fetal palate (although limited in views) and neck appears to be normal without evidence of mass. There is no evidence of of lower spine or pelvic abnormalities. She passed her GTT. I discussed the diagnosis, management, prognosis and clinical implications of polyhydramnios and the clinical implications during pregnancy. Amniotic fluid is made from the fetal kidneys and at the normal fluid ranges from 5 to 25 cm on amniotic fluid index.  Potential causes of elevated amniotic fluid in pregnancy. I discussed that most the time the cause is unknown but the most common cause that is identifiable is maternal diabetes.  Also discussed that there is a potential for birth defects or neurologic conditions that can result in elevated amniotic fluid. Potential complications associated with polyhydramnios include malpresentation, increased risk of cord prolapse,  increased discomfort during the end of pregnancy, increased risk of preterm contractions and increased risk of stillbirth.  In particular the risk of fetal anomalies and neonatal abnormalities increases with the degree of polyhydramnios.  Approximately 1% of newborns and 5 to 10% of fetuses with mild polyhydramnios will have an abnormality (tracheoesophageal fistula are among the most common abnormalities associated with polyhydramnios).  This increases to 2-5 times a risk with moderate and severe polyhydramnios.  Currently there is no treatment of polyhydramnios if it is idiopathic or due to fetal abnormalities.     Sonographic findings Single intrauterine pregnancy at 30w 0d  Fetal cardiac activity:  Observed and appears normal. Presentation: Cephalic. The anatomic structures that were well seen appear normal without evidence of soft markers. Due to poor acoustic windows some structures remain suboptimally visualized. Fetal biometry shows the estimated fetal weight at the 22 percentile.  Amniotic fluid: Polyhydramnios.  MVP: 15.07 cm. Placenta: Posterior Fundal. Adnexa: No adnexal mass visualized. Cervical length:  n/a  There are limitations of prenatal ultrasound such as the inability to detect certain abnormalities due to poor visualization. Various factors such as fetal position, gestational age and maternal body habitus may increase the difficulty in visualizing the fetal anatomy.    Recommendations -EDD is Estimated Date of Delivery: 07/10/23 based on today's [place]. -Detailed ultrasound was done today without abnormalities. -F/u growth ultrasound in 3 weeks due to new due date. -Notify pediatric team regarding the moderate polyhydramnios.  There is a approximately a 5 to 10% chance of a birth defect causing excess fluid in the setting of idiopathic polyhydramnios of this degree. -Continue routine prenatal care with referring OB provider.  Review of Systems: A review of systems was performed  and was negative except per HPI   Vitals and Physical Exam    05/01/2023    2:06 PM 05/01/2023   10:01 AM 04/20/2023    9:59 AM  Vitals with BMI  Weight  121 lbs 5 oz 120 lbs  Systolic 116 116 161  Diastolic 61 75 72  Pulse 84 85 103    Sitting comfortably on the sonogram table Nonlabored breathing Normal rate and rhythm Abdomen is nontender  Past pregnancies OB History  Gravida Para Term Preterm AB Living  1 0 0 0 0 0  SAB IAB Ectopic Multiple Live Births  0 0 0 0 0    # Outcome Date GA Lbr Len/2nd Weight Sex Type Anes PTL Lv  1 Current             I spent 30 minutes reviewing the patients chart, including labs and images as well as counseling the patient about her medical conditions. Greater than 50% of the time was spent in direct face-to-face patient counseling.  Braxton Feathers  MFM, [facility]   05/01/2023  3:07 PM

## 2023-05-02 DIAGNOSIS — Z419 Encounter for procedure for purposes other than remedying health state, unspecified: Secondary | ICD-10-CM | POA: Diagnosis not present

## 2023-05-15 ENCOUNTER — Other Ambulatory Visit: Payer: Self-pay

## 2023-05-15 ENCOUNTER — Ambulatory Visit: Payer: Medicaid Other | Admitting: Obstetrics and Gynecology

## 2023-05-15 VITALS — BP 117/76 | HR 90 | Wt 129.9 lb

## 2023-05-15 DIAGNOSIS — O0930 Supervision of pregnancy with insufficient antenatal care, unspecified trimester: Secondary | ICD-10-CM

## 2023-05-15 DIAGNOSIS — Z3493 Encounter for supervision of normal pregnancy, unspecified, third trimester: Secondary | ICD-10-CM

## 2023-05-15 DIAGNOSIS — O2343 Unspecified infection of urinary tract in pregnancy, third trimester: Secondary | ICD-10-CM | POA: Diagnosis not present

## 2023-05-15 DIAGNOSIS — O99013 Anemia complicating pregnancy, third trimester: Secondary | ICD-10-CM

## 2023-05-15 DIAGNOSIS — O98813 Other maternal infectious and parasitic diseases complicating pregnancy, third trimester: Secondary | ICD-10-CM

## 2023-05-15 DIAGNOSIS — O409XX0 Polyhydramnios, unspecified trimester, not applicable or unspecified: Secondary | ICD-10-CM

## 2023-05-15 DIAGNOSIS — Z3A32 32 weeks gestation of pregnancy: Secondary | ICD-10-CM

## 2023-05-15 DIAGNOSIS — A749 Chlamydial infection, unspecified: Secondary | ICD-10-CM

## 2023-05-15 NOTE — Progress Notes (Signed)
   PRENATAL VISIT NOTE  Subjective:  Tiffany Kline is a 18 y.o. G1P0000 at [redacted]w[redacted]d being seen today for ongoing prenatal care.  She is currently monitored for the following issues for this low-risk pregnancy and has Supervision of low-risk pregnancy; Late prenatal care; Chlamydia infection affecting pregnancy in third trimester; Anemia in pregnancy; UTI in pregnancy, antepartum, third trimester; and Polyhydramnios affecting pregnancy on their problem list.  Patient reports no complaints.  Contractions: Not present. Vag. Bleeding: None.  Movement: Present. Denies leaking of fluid.   The following portions of the patient's history were reviewed and updated as appropriate: allergies, current medications, past family history, past medical history, past social history, past surgical history and problem list.   Objective:   Vitals:   05/15/23 1110  BP: 117/76  Pulse: 90  Weight: 129 lb 14.4 oz (58.9 kg)    Fetal Status: Fetal Heart Rate (bpm): 139   Movement: Present     General:  Alert, oriented and cooperative. Patient is in no acute distress.  Skin: Skin is warm and dry. No rash noted.   Cardiovascular: Normal heart rate noted  Respiratory: Normal respiratory effort, no problems with respiration noted  Abdomen: Soft, gravid, appropriate for gestational age.  Pain/Pressure: Present     Pelvic: Cervical exam deferred        Extremities: Normal range of motion.  Edema: None  Mental Status: Normal mood and affect. Normal behavior. Normal judgment and thought content.   Assessment and Plan:  Pregnancy: G1P0000 at [redacted]w[redacted]d 1. Encounter for supervision of low-risk pregnancy in third trimester (Primary) BP and FHR normal Doing well, feeling regular movement   2. Late prenatal care   3. UTI in pregnancy, antepartum, third trimester To check urine culture later visit   4. Chlamydia infection affecting pregnancy in third trimester Completed abx, TOC next visit   5. [redacted] weeks gestation of  pregnancy Still considering pediatrician  6. Polyhydramnios affecting pregnancy 1/31 u/s AFI 31,  MVP 15 EFW 22% Follow up 2/21  7. Anemia during pregnancy in third trimester Hgb 8.5, discussed symptoms that can come with anemia, and recommendations on why we encourage iv iron She is going to call the number to schedule    Preterm labor symptoms and general obstetric precautions including but not limited to vaginal bleeding, contractions, leaking of fluid and fetal movement were reviewed in detail with the patient. Please refer to After Visit Summary for other counseling recommendations.   Return in about 2 weeks (around 05/29/2023) for OB VISIT (MD or APP).  Future Appointments  Date Time Provider Department Center  05/22/2023  7:30 AM [facility] US1 [facility]    Albertine Grates, FNP

## 2023-05-18 ENCOUNTER — Encounter: Payer: Self-pay | Admitting: Obstetrics and Gynecology

## 2023-05-18 NOTE — Progress Notes (Signed)
 Wyvonne Lenz, RN  [place] Bing, MD Cc: Desma Mcgregor, [facility]; Demetrius Charity, [facility] Good afternoon Dr. Vergie Living,  We just wanted to let you know that we have made multiple attempts to reach this patient since 04/23/23 regarding the iron infusions that you had ordered, but the patient has not returned our messages. We will continue to reach out, but wanted you to be aware.  Thank you, Denny Peon, RN  [facility] [address] [place] [place], [place] [address] [phone]

## 2023-05-22 ENCOUNTER — Other Ambulatory Visit: Payer: Self-pay

## 2023-05-22 ENCOUNTER — Ambulatory Visit: Payer: Medicaid Other | Attending: Maternal & Fetal Medicine

## 2023-05-22 ENCOUNTER — Other Ambulatory Visit: Payer: Self-pay | Admitting: Maternal & Fetal Medicine

## 2023-05-22 ENCOUNTER — Other Ambulatory Visit: Payer: Self-pay | Admitting: *Deleted

## 2023-05-22 DIAGNOSIS — O403XX0 Polyhydramnios, third trimester, not applicable or unspecified: Secondary | ICD-10-CM | POA: Diagnosis not present

## 2023-05-22 DIAGNOSIS — O99013 Anemia complicating pregnancy, third trimester: Secondary | ICD-10-CM | POA: Diagnosis not present

## 2023-05-22 DIAGNOSIS — O409XX0 Polyhydramnios, unspecified trimester, not applicable or unspecified: Secondary | ICD-10-CM

## 2023-05-22 DIAGNOSIS — O0933 Supervision of pregnancy with insufficient antenatal care, third trimester: Secondary | ICD-10-CM

## 2023-05-22 DIAGNOSIS — Z3A33 33 weeks gestation of pregnancy: Secondary | ICD-10-CM | POA: Diagnosis not present

## 2023-05-22 DIAGNOSIS — D649 Anemia, unspecified: Secondary | ICD-10-CM | POA: Diagnosis not present

## 2023-05-29 ENCOUNTER — Ambulatory Visit: Payer: Medicaid Other | Attending: Obstetrics and Gynecology | Admitting: *Deleted

## 2023-05-29 ENCOUNTER — Other Ambulatory Visit: Payer: Self-pay

## 2023-05-29 VITALS — BP 125/65

## 2023-05-29 DIAGNOSIS — Z3A34 34 weeks gestation of pregnancy: Secondary | ICD-10-CM | POA: Insufficient documentation

## 2023-05-29 DIAGNOSIS — O403XX0 Polyhydramnios, third trimester, not applicable or unspecified: Secondary | ICD-10-CM | POA: Diagnosis not present

## 2023-05-29 NOTE — Procedures (Signed)
 Marilla Thurmond 2005-10-01 [redacted]w[redacted]d  Fetus A Non-Stress Test Interpretation for 05/29/23 (NST only)  Indication: Polyhydramnios  Fetal Heart Rate A Mode: External Baseline Rate (A): 145 bpm Variability: Moderate Accelerations: 15 x 15 Decelerations: None Multiple birth?: No  Uterine Activity Mode: Palpation, Toco Contraction Frequency (min): Irreg w/erratic UI Contraction Duration (sec): 10-30 Contraction Quality: Mild Resting Tone Palpated: Relaxed Resting Time: Adequate  Interpretation (Fetal Testing) Nonstress Test Interpretation: Reactive Comments: Dr. Parke Poisson reviewed tracing.

## 2023-05-30 DIAGNOSIS — Z419 Encounter for procedure for purposes other than remedying health state, unspecified: Secondary | ICD-10-CM | POA: Diagnosis not present

## 2023-05-31 NOTE — Progress Notes (Unsigned)
   PRENATAL VISIT NOTE  Subjective:  Tiffany Kline is a 18 y.o. G1P0000 at [redacted]w[redacted]d being seen today for ongoing prenatal care.  She is currently monitored for the following issues for this {Blank single:19197::"high-risk","low-risk"} pregnancy and has Supervision of low-risk pregnancy; Late prenatal care; Chlamydia infection affecting pregnancy in third trimester; Anemia in pregnancy; UTI in pregnancy, antepartum, third trimester; and Polyhydramnios affecting pregnancy on their problem list.  Patient reports {sx:14538}.   .  .   . Denies leaking of fluid.   The following portions of the patient's history were reviewed and updated as appropriate: allergies, current medications, past family history, past medical history, past social history, past surgical history and problem list.   Objective:  There were no vitals filed for this visit.  Fetal Status:           General:  Alert, oriented and cooperative. Patient is in no acute distress.  Skin: Skin is warm and dry. No rash noted.   Cardiovascular: Normal heart rate noted  Respiratory: Normal respiratory effort, no problems with respiration noted  Abdomen: Soft, gravid, appropriate for gestational age.        Pelvic: {Blank single:19197::"Cervical exam performed in the presence of a chaperone","Cervical exam deferred"}        Extremities: Normal range of motion.     Mental Status: Normal mood and affect. Normal behavior. Normal judgment and thought content.   Assessment and Plan:  Pregnancy: G1P0000 at 103w2d 1. Encounter for supervision of low-risk pregnancy in third trimester (Primary)   2. Late prenatal care   3. UTI in pregnancy, antepartum, third trimester TOC today  4. Chlamydia infection affecting pregnancy in third trimester TOC today  5. Polyhydramnios affecting pregnancy 2/21 u/s 32.14cm, MVP: 9.57 cm.  BPP 8/8 To start weekly testing 3/21 continue growth q 4-6 wks Delivery around 39 weeks   6. [redacted] weeks gestation of  pregnancy   {Blank single:19197::"Term","Preterm"} labor symptoms and general obstetric precautions including but not limited to vaginal bleeding, contractions, leaking of fluid and fetal movement were reviewed in detail with the patient. Please refer to After Visit Summary for other counseling recommendations.   No follow-ups on file.  Future Appointments  Date Time Provider Department Center  06/01/2023  9:35 AM Sue Lush, FNP [facility]  06/05/2023 10:45 AM [facility]  06/12/2023 10:45 AM [facility]  06/19/2023 11:30 AM [facility] US2 [facility]    Albertine Grates, FNP

## 2023-06-01 ENCOUNTER — Other Ambulatory Visit (HOSPITAL_COMMUNITY)
Admission: RE | Admit: 2023-06-01 | Discharge: 2023-06-01 | Disposition: A | Discharge status: Home / Self Care | Source: Ambulatory Visit

## 2023-06-01 ENCOUNTER — Ambulatory Visit (INDEPENDENT_AMBULATORY_CARE_PROVIDER_SITE_OTHER): Payer: Medicaid Other | Admitting: Obstetrics and Gynecology

## 2023-06-01 ENCOUNTER — Inpatient Hospital Stay (HOSPITAL_COMMUNITY)
Admission: AD | Admit: 2023-06-01 | Discharge: 2023-06-03 | DRG: 806 | Disposition: A | Discharge status: Home / Self Care | Attending: Obstetrics and Gynecology | Admitting: Obstetrics and Gynecology

## 2023-06-01 ENCOUNTER — Inpatient Hospital Stay (HOSPITAL_COMMUNITY): Admitting: Anesthesiology

## 2023-06-01 ENCOUNTER — Inpatient Hospital Stay (HOSPITAL_COMMUNITY): Admission: AD | Admit: 2023-06-01 | Source: Home / Self Care | Admitting: Family Medicine

## 2023-06-01 ENCOUNTER — Other Ambulatory Visit: Payer: Self-pay

## 2023-06-01 ENCOUNTER — Encounter (HOSPITAL_COMMUNITY): Payer: Self-pay | Admitting: Obstetrics and Gynecology

## 2023-06-01 VITALS — BP 111/77 | HR 96 | Wt 125.0 lb

## 2023-06-01 DIAGNOSIS — Z3A34 34 weeks gestation of pregnancy: Secondary | ICD-10-CM

## 2023-06-01 DIAGNOSIS — O42919 Preterm premature rupture of membranes, unspecified as to length of time between rupture and onset of labor, unspecified trimester: Secondary | ICD-10-CM

## 2023-06-01 DIAGNOSIS — O0930 Supervision of pregnancy with insufficient antenatal care, unspecified trimester: Secondary | ICD-10-CM

## 2023-06-01 DIAGNOSIS — O409XX0 Polyhydramnios, unspecified trimester, not applicable or unspecified: Secondary | ICD-10-CM | POA: Diagnosis not present

## 2023-06-01 DIAGNOSIS — O98813 Other maternal infectious and parasitic diseases complicating pregnancy, third trimester: Secondary | ICD-10-CM | POA: Diagnosis not present

## 2023-06-01 DIAGNOSIS — O42913 Preterm premature rupture of membranes, unspecified as to length of time between rupture and onset of labor, third trimester: Secondary | ICD-10-CM | POA: Diagnosis present

## 2023-06-01 DIAGNOSIS — Z3493 Encounter for supervision of normal pregnancy, unspecified, third trimester: Secondary | ICD-10-CM

## 2023-06-01 DIAGNOSIS — A568 Sexually transmitted chlamydial infection of other sites: Secondary | ICD-10-CM | POA: Diagnosis present

## 2023-06-01 DIAGNOSIS — O411230 Chorioamnionitis, third trimester, not applicable or unspecified: Secondary | ICD-10-CM | POA: Diagnosis not present

## 2023-06-01 DIAGNOSIS — O9832 Other infections with a predominantly sexual mode of transmission complicating childbirth: Secondary | ICD-10-CM | POA: Diagnosis not present

## 2023-06-01 DIAGNOSIS — O0933 Supervision of pregnancy with insufficient antenatal care, third trimester: Secondary | ICD-10-CM | POA: Diagnosis not present

## 2023-06-01 DIAGNOSIS — O2343 Unspecified infection of urinary tract in pregnancy, third trimester: Secondary | ICD-10-CM | POA: Diagnosis not present

## 2023-06-01 DIAGNOSIS — Z3A32 32 weeks gestation of pregnancy: Secondary | ICD-10-CM

## 2023-06-01 DIAGNOSIS — A749 Chlamydial infection, unspecified: Secondary | ICD-10-CM | POA: Diagnosis not present

## 2023-06-01 DIAGNOSIS — O403XX0 Polyhydramnios, third trimester, not applicable or unspecified: Secondary | ICD-10-CM | POA: Diagnosis not present

## 2023-06-01 DIAGNOSIS — D62 Acute posthemorrhagic anemia: Secondary | ICD-10-CM | POA: Diagnosis not present

## 2023-06-01 DIAGNOSIS — O43893 Other placental disorders, third trimester: Secondary | ICD-10-CM | POA: Diagnosis not present

## 2023-06-01 LAB — TYPE AND SCREEN
ABO/RH(D): O POS
Antibody Screen: NEGATIVE

## 2023-06-01 LAB — GROUP B STREP BY PCR: Group B strep by PCR: NEGATIVE

## 2023-06-01 LAB — CBC
HCT: 34.2 % — ABNORMAL LOW (ref 36.0–46.0)
Hemoglobin: 10.5 g/dL — ABNORMAL LOW (ref 12.0–15.0)
MCH: 24 pg — ABNORMAL LOW (ref 26.0–34.0)
MCHC: 30.7 g/dL (ref 30.0–36.0)
MCV: 78.3 fL — ABNORMAL LOW (ref 80.0–100.0)
Platelets: 149 10*3/uL — ABNORMAL LOW (ref 150–400)
RBC: 4.37 MIL/uL (ref 3.87–5.11)
RDW: 16.5 % — ABNORMAL HIGH (ref 11.5–15.5)
WBC: 10.8 10*3/uL — ABNORMAL HIGH (ref 4.0–10.5)
nRBC: 0 % (ref 0.0–0.2)

## 2023-06-01 MED ORDER — OXYTOCIN-SODIUM CHLORIDE 30-0.9 UT/500ML-% IV SOLN
2.5000 [IU]/h | INTRAVENOUS | Status: DC
  Administered 2023-06-01: 2.5 [IU]/h via INTRAVENOUS
  Filled 2023-06-01: qty 500

## 2023-06-01 MED ORDER — BETAMETHASONE SOD PHOS & ACET 6 (3-3) MG/ML IJ SUSP
12.0000 mg | INTRAMUSCULAR | Status: DC
  Administered 2023-06-01: 12 mg via INTRAMUSCULAR
  Filled 2023-06-01: qty 5

## 2023-06-01 MED ORDER — COCONUT OIL OIL: 1.0000 | TOPICAL_OIL | Status: DC | PRN

## 2023-06-01 MED ORDER — SODIUM CHLORIDE 0.9 % IV SOLN
5.0000 10*6.[IU] | Freq: Once | INTRAVENOUS | Status: AC
  Administered 2023-06-01: 5 10*6.[IU] via INTRAVENOUS
  Filled 2023-06-01: qty 5

## 2023-06-01 MED ORDER — SIMETHICONE 80 MG PO CHEW: 80.0000 mg | CHEWABLE_TABLET | ORAL | Status: DC | PRN

## 2023-06-01 MED ORDER — LACTATED RINGERS IV SOLN
500.0000 mL | Freq: Once | INTRAVENOUS | Status: AC
  Administered 2023-06-01: 500 mL via INTRAVENOUS

## 2023-06-01 MED ORDER — BENZOCAINE-MENTHOL 20-0.5 % EX AERO
1.0000 | INHALATION_SPRAY | CUTANEOUS | Status: DC | PRN
  Administered 2023-06-03: 1 via TOPICAL
  Filled 2023-06-01: qty 56

## 2023-06-01 MED ORDER — ONDANSETRON HCL 4 MG/2ML IJ SOLN: 4.0000 mg | INTRAMUSCULAR | Status: DC | PRN

## 2023-06-01 MED ORDER — IBUPROFEN 600 MG PO TABS
600.0000 mg | ORAL_TABLET | Freq: Four times a day (QID) | ORAL | Status: DC
  Administered 2023-06-01 – 2023-06-03 (×6): 600 mg via ORAL
  Filled 2023-06-01 (×6): qty 1

## 2023-06-01 MED ORDER — ONDANSETRON HCL 4 MG/2ML IJ SOLN: 4.0000 mg | Freq: Four times a day (QID) | INTRAMUSCULAR | Status: DC | PRN

## 2023-06-01 MED ORDER — PHENYLEPHRINE 80 MCG/ML (10ML) SYRINGE FOR IV PUSH (FOR BLOOD PRESSURE SUPPORT)
80.0000 ug | PREFILLED_SYRINGE | INTRAVENOUS | Status: DC | PRN
  Filled 2023-06-01: qty 10

## 2023-06-01 MED ORDER — ONDANSETRON HCL 4 MG PO TABS: 4.0000 mg | ORAL_TABLET | ORAL | Status: DC | PRN

## 2023-06-01 MED ORDER — SOD CITRATE-CITRIC ACID 500-334 MG/5ML PO SOLN: 30.0000 mL | ORAL | Status: DC | PRN

## 2023-06-01 MED ORDER — DIPHENHYDRAMINE HCL 25 MG PO CAPS: 25.0000 mg | ORAL_CAPSULE | Freq: Four times a day (QID) | ORAL | Status: DC | PRN

## 2023-06-01 MED ORDER — LIDOCAINE HCL (PF) 1 % IJ SOLN
30.0000 mL | INTRAMUSCULAR | Status: AC | PRN
  Administered 2023-06-01: 30 mL via SUBCUTANEOUS
  Filled 2023-06-01: qty 30

## 2023-06-01 MED ORDER — TRANEXAMIC ACID-NACL 1000-0.7 MG/100ML-% IV SOLN
INTRAVENOUS | Status: AC
  Filled 2023-06-01: qty 100

## 2023-06-01 MED ORDER — AZITHROMYCIN 250 MG PO TABS
250.0000 mg | ORAL_TABLET | ORAL | Status: DC
  Administered 2023-06-01: 250 mg via ORAL
  Filled 2023-06-01 (×2): qty 1
  Filled 2023-06-01: qty 4
  Filled 2023-06-01 (×2): qty 1

## 2023-06-01 MED ORDER — EPHEDRINE 5 MG/ML INJ: 10.0000 mg | INTRAVENOUS | Status: DC | PRN

## 2023-06-01 MED ORDER — LIDOCAINE HCL (PF) 1 % IJ SOLN
INTRAMUSCULAR | Status: DC | PRN
  Administered 2023-06-01 (×2): 4 mL via EPIDURAL

## 2023-06-01 MED ORDER — DIPHENHYDRAMINE HCL 50 MG/ML IJ SOLN: 12.5000 mg | INTRAMUSCULAR | Status: DC | PRN

## 2023-06-01 MED ORDER — FENTANYL-BUPIVACAINE-NACL 0.5-0.125-0.9 MG/250ML-% EP SOLN
12.0000 mL/h | EPIDURAL | Status: DC | PRN
  Administered 2023-06-01: 12 mL/h via EPIDURAL
  Filled 2023-06-01: qty 250

## 2023-06-01 MED ORDER — ACETAMINOPHEN 325 MG PO TABS: 650.0000 mg | ORAL_TABLET | ORAL | Status: DC | PRN

## 2023-06-01 MED ORDER — WITCH HAZEL-GLYCERIN EX PADS
1.0000 | MEDICATED_PAD | CUTANEOUS | Status: DC | PRN
  Administered 2023-06-03: 1 via TOPICAL

## 2023-06-01 MED ORDER — LACTATED RINGERS IV SOLN: 500.0000 mL | INTRAVENOUS | Status: DC | PRN

## 2023-06-01 MED ORDER — LACTATED RINGERS IV SOLN: INTRAVENOUS | Status: DC

## 2023-06-01 MED ORDER — FENTANYL CITRATE (PF) 100 MCG/2ML IJ SOLN: 50.0000 ug | INTRAMUSCULAR | Status: DC | PRN

## 2023-06-01 MED ORDER — SENNOSIDES-DOCUSATE SODIUM 8.6-50 MG PO TABS
2.0000 | ORAL_TABLET | Freq: Every day | ORAL | Status: DC
  Administered 2023-06-02 – 2023-06-03 (×2): 2 via ORAL
  Filled 2023-06-01 (×2): qty 2

## 2023-06-01 MED ORDER — TETANUS-DIPHTH-ACELL PERTUSSIS 5-2.5-18.5 LF-MCG/0.5 IM SUSY: 0.5000 mL | PREFILLED_SYRINGE | Freq: Once | INTRAMUSCULAR | Status: DC

## 2023-06-01 MED ORDER — PRENATAL MULTIVITAMIN CH
1.0000 | ORAL_TABLET | Freq: Every day | ORAL | Status: DC
  Administered 2023-06-02 – 2023-06-03 (×2): 1 via ORAL
  Filled 2023-06-01 (×2): qty 1

## 2023-06-01 MED ORDER — AZITHROMYCIN 250 MG PO TABS
250.0000 mg | ORAL_TABLET | ORAL | Status: AC
  Administered 2023-06-01 – 2023-06-02 (×3): 250 mg via ORAL

## 2023-06-01 MED ORDER — DIBUCAINE (PERIANAL) 1 % EX OINT: 1.0000 | TOPICAL_OINTMENT | CUTANEOUS | Status: DC | PRN

## 2023-06-01 MED ORDER — PENICILLIN G POT IN DEXTROSE 60000 UNIT/ML IV SOLN
3.0000 10*6.[IU] | INTRAVENOUS | Status: DC
  Filled 2023-06-01 (×3): qty 50

## 2023-06-01 MED ORDER — OXYTOCIN BOLUS FROM INFUSION
333.0000 mL | Freq: Once | INTRAVENOUS | Status: AC
  Administered 2023-06-01: 333 mL via INTRAVENOUS

## 2023-06-01 MED ORDER — PHENYLEPHRINE 80 MCG/ML (10ML) SYRINGE FOR IV PUSH (FOR BLOOD PRESSURE SUPPORT): 80.0000 ug | PREFILLED_SYRINGE | INTRAVENOUS | Status: DC | PRN

## 2023-06-01 NOTE — Anesthesia Postprocedure Evaluation (Signed)
 Anesthesia Post Note  Patient: Tiffany Kline  Procedure(s) Performed: AN AD HOC LABOR EPIDURAL     Patient location during evaluation: Mother Baby Anesthesia Type: Epidural Level of consciousness: awake and alert Pain management: pain level controlled Vital Signs Assessment: post-procedure vital signs reviewed and stable Respiratory status: spontaneous breathing, nonlabored ventilation and respiratory function stable Cardiovascular status: stable Postop Assessment: no headache, no backache, epidural receding and able to ambulate Anesthetic complications: no   No notable events documented.  Last Vitals:  Vitals:   06/01/23 1845 06/01/23 1900  BP: 123/80 121/81  Pulse: 89 91  Resp:    Temp:    SpO2:      Last Pain:  Vitals:   06/01/23 1850  TempSrc:   PainSc: 0-No pain   Pain Goal:                Epidural/Spinal Function Cutaneous sensation: Tingles (06/01/23 1835), Patient able to flex knees: Yes (06/01/23 1835), Patient able to lift hips off bed: Yes (06/01/23 1835), Back pain beyond tenderness at insertion site: No (06/01/23 1835), Progressively worsening motor and/or sensory loss: No (06/01/23 1835), Bowel and/or bladder incontinence post epidural: No (06/01/23 1835)  Myan Suit

## 2023-06-01 NOTE — Anesthesia Procedure Notes (Signed)
 Epidural Patient location during procedure: OB Start time: 06/01/2023 4:54 PM End time: 06/01/2023 4:59 PM  Staffing Anesthesiologist: Linton Rump, MD Performed: anesthesiologist   Preanesthetic Checklist Completed: patient identified, IV checked, site marked, risks and benefits discussed, surgical consent, monitors and equipment checked, pre-op evaluation and timeout performed  Epidural Patient position: sitting Prep: DuraPrep and site prepped and draped Patient monitoring: continuous pulse ox and blood pressure Approach: midline Location: L3-L4 Injection technique: LOR saline  Needle:  Needle type: Tuohy  Needle gauge: 17 G Needle length: 9 cm and 9 Needle insertion depth: 3 cm Catheter type: closed end flexible Catheter size: 19 Gauge Catheter at skin depth: 8 cm Test dose: negative  Assessment Events: blood not aspirated, no cerebrospinal fluid, injection not painful, no injection resistance, no paresthesia and negative IV test  Additional Notes The patient has requested an epidural for labor pain management. Risks and benefits including, but not limited to, infection, bleeding, local anesthetic toxicity, headache, hypotension, back pain, block failure, etc. were discussed with the patient. The patient expressed understanding and consented to the procedure. I confirmed that the patient has no bleeding disorders and is not taking blood thinners. I confirmed the patient's last platelet count with the nurse. A time-out was performed immediately prior to the procedure. Please see nursing documentation for vital signs. Sterile technique was used throughout the whole procedure. Once LOR achieved, the epidural catheter threaded easily without resistance. Aspiration of the catheter was negative for blood and CSF. The epidural was dosed slowly and an infusion was started.  1 attempt(s)Reason for block:procedure for pain

## 2023-06-01 NOTE — Discharge Summary (Signed)
 Postpartum Discharge Summary  Date of Service updated***     Patient Name: Tiffany Kline DOB: January 12, 2006 MRN: [id number]  Date of admission: 06/01/2023 Delivery date:06/01/2023 Delivering provider: Lamont Snowball A Date of discharge: 06/01/2023  Admitting diagnosis: Preterm premature rupture of membranes (PPROM) with unknown onset of labor [O42.919] Intrauterine pregnancy: [redacted]w[redacted]d     Secondary diagnosis:  Principal Problem:   Preterm premature rupture of membranes (PPROM) with unknown onset of labor  Additional problems: CT dx in pregnancy x2    Discharge diagnosis: Preterm Pregnancy Delivered                                              Post partum procedures:*** Augmentation: AROM Complications: None  Hospital course: Onset of Labor With Vaginal Delivery      18 y.o. yo G1P0101 at [redacted]w[redacted]d was admitted in Latent Labor on 06/01/2023. Labor course was complicated by***  Membrane Rupture Time/Date: 6:00 AM,06/01/2023  Delivery Method:Vaginal, Spontaneous Operative Delivery:N/A Episiotomy: None Lacerations:  1st degree;Labial Patient had a postpartum course complicated by ***.  She is ambulating, tolerating a regular diet, passing flatus, and urinating well. Patient is discharged home in stable condition on 06/01/23.  Newborn Data: Birth date:06/01/2023 Birth time:5:41 PM Gender:Female Living status:Living Apgars:6 ,8  Weight:2110 g  Magnesium Sulfate received: {Mag received:30440022} BMZ received: Yes x1  Rhophylac:N/A WUJ:{WJX:91478295} T-DaP:Given prenatally Flu: No RSV Vaccine received: {RSV:31013} Transfusion:{Transfusion received:30440034}  Immunizations received: Immunization History  Administered Date(s) Administered   Tdap 04/20/2023    Physical exam  Vitals:   06/01/23 1845 06/01/23 1900 06/01/23 1933 06/01/23 1945  BP: 123/80 121/81 130/82 117/73  Pulse: 89 91 (!) 110 (!) 104  Resp:    16  Temp:   98.5 F (36.9 C) 98.2 F (36.8 C)  TempSrc:   Oral  Oral  SpO2:    100%  Weight:      Height:       General: {Exam; general:21111117} Lochia: {Desc; appropriate/inappropriate:30686::"appropriate"} Uterine Fundus: {Desc; firm/soft:30687} Incision: {Exam; incision:21111123} DVT Evaluation: {Exam; dvt:2111122} Labs: Lab Results  Component Value Date   WBC 10.8 (H) 06/01/2023   HGB 10.5 (L) 06/01/2023   HCT 34.2 (L) 06/01/2023   MCV 78.3 (L) 06/01/2023   PLT 149 (L) 06/01/2023      Latest Ref Rng & Units 06/26/2022    6:57 PM  CMP  Glucose 70 - 99 mg/dL 82   BUN 6 - 20 mg/dL 28   Creatinine 6.21 - 1.00 mg/dL 3.08   Sodium 657 - 846 mmol/L 136   Potassium 3.5 - 5.1 mmol/L 4.4   Chloride 98 - 111 mmol/L 107   CO2 22 - 32 mmol/L 21   Calcium 8.9 - 10.3 mg/dL 8.5   Total Protein 6.5 - 8.1 g/dL 7.5   Total Bilirubin 0.3 - 1.2 mg/dL 1.3   Alkaline Phos 38 - 126 U/L 49   AST 15 - 41 U/L 30   ALT 0 - 44 U/L 11    Edinburgh Score:     No data to display         No data recorded  After visit meds:  Allergies as of 06/01/2023       Reactions   Shellfish Allergy Anaphylaxis   Fish Allergy Hives, Rash   "shrimp"     Med Rec must be completed prior to using this  SMARTLINK***        Discharge home in stable condition Infant Feeding: {Baby feeding:23562} Infant Disposition:{CHL IP OB HOME WITH AVWUJW:11914} Discharge instruction: per After Visit Summary and Postpartum booklet. Activity: Advance as tolerated. Pelvic rest for 6 weeks.  Diet: {OB NWGN:56213086} Future Appointments: Future Appointments  Date Time Provider Department Center  06/05/2023 10:45 AM [facility]ST [facility]  06/08/2023 10:55 AM Christean Leaf [facility]  06/12/2023 10:45 AM [facility] NST [facility]  06/19/2023 11:30 AM [facility] US2 [facility]   Follow up Visit:   Please schedule this patient for a In person postpartum visit in 6 weeks with the following provider: Lamont Snowball, CNM Additional Postpartum F/U: none   Low risk  pregnancy complicated by:  preterm labor Delivery mode:  Vaginal, Spontaneous Anticipated Birth Control:  Unsure   06/01/2023 Richardson Landry, CNM

## 2023-06-01 NOTE — MAU Note (Signed)
 Meta Kroenke is a 18 y.o. at [redacted]w[redacted]d here in MAU reporting: sent from [facility] office for admission to L&D for ROM since today  @ 0600, reports fluid is clear.  Denies VB.  Endorses +FM.  LMP: NA Onset of complaint: today Pain score: 0 Vitals:   06/01/23 1323  BP: 103/63  Pulse: 85  Resp: 18  Temp: 98 F (36.7 C)  SpO2: 100%     FHT: 162 bpm FM audible  Lab orders placed from triage: None

## 2023-06-01 NOTE — H&P (Signed)
 LABOR AND DELIVERY ADMISSION HISTORY AND PHYSICAL NOTE  Tiffany Kline is a 18 y.o. female G1P0000 with IUP at [redacted]w[redacted]d presenting for rupture of membranes.  Seen earlier today in clinic, at that visit she was fern positive and actively having large amount of leaking.  Currently reports some mild cramping but no contractions. No bleeding. Normal fetal movement.    She plans on breast feeding feeding. Her contraception plan is:  undecided .  Prenatal History/Complications: [facility]  Sono:  @[redacted]w[redacted]d , CWD, normal anatomy, cephalic presentation, posterior placenta, 31%ile, EFW 2023g, AFI 32 cm  Pregnancy complications:  Patient Active Problem List   Diagnosis Date Noted   Polyhydramnios affecting pregnancy 05/01/2023   UTI in pregnancy, antepartum, third trimester 04/24/2023   Chlamydia infection affecting pregnancy in third trimester 04/22/2023   Anemia in pregnancy 04/22/2023   Supervision of low-risk pregnancy 04/13/2023   Late prenatal care 04/13/2023    Past Medical History: Past Medical History:  Diagnosis Date   Anemia    No pertinent past medical history     Past Surgical History: Past Surgical History:  Procedure Laterality Date   NO PAST SURGERIES      Obstetrical History: OB History     Gravida  1   Para  0   Term  0   Preterm  0   AB  0   Living  0      SAB  0   IAB  0   Ectopic  0   Multiple  0   Live Births  0           Social History: Social History   Socioeconomic History   Marital status: Single    Spouse name: Not on file   Number of children: Not on file   Years of education: Not on file   Highest education level: GED or equivalent  Occupational History   Not on file  Tobacco Use   Smoking status: Never    Passive exposure: Yes   Smokeless tobacco: Not on file  Vaping Use   Vaping status: Former   Quit date: 11/30/2022   Substances: Flavoring  Substance and Sexual Activity   Alcohol use: Never    Drug use: Never   Sexual activity: Yes    Birth control/protection: None  Other Topics Concern   Not on file  Social History Narrative   Not on file   Social Drivers of Health   Financial Resource Strain: Low Risk  (05/01/2023)   Overall Financial Resource Strain (CARDIA)    Difficulty of Paying Living Expenses: Not very hard  Food Insecurity: No Food Insecurity (05/01/2023)   Hunger Vital Sign    Worried About Running Out of Food in the Last Year: Never true    Ran Out of Food in the Last Year: Never true  Transportation Needs: No Transportation Needs (05/01/2023)   PRAPARE - Administrator, Civil Service (Medical): No    Lack of Transportation (Non-Medical): No  Physical Activity: Sufficiently Active (05/01/2023)   Exercise Vital Sign    Days of Exercise per Week: 6 days    Minutes of Exercise per Session: 150+ min  Stress: No Stress Concern Present (05/01/2023)   [employer] of Occupational Health - Occupational Stress Questionnaire    Feeling of Stress : Only a little  Social Connections: Moderately Isolated (05/01/2023)   Social Connection and Isolation Panel [NHANES]    Frequency of Communication with Friends and  Family: More than three times a week    Frequency of Social Gatherings with Friends and Family: Twice a week    Attends Religious Services: 1 to 4 times per year    Active Member of [employer] or Organizations: No    Attends Engineer, structural: Not on file    Marital Status: Never married    Family History: Family History  Problem Relation Age of Onset   Kidney failure Mother     Allergies: Allergies  Allergen Reactions   Shellfish Allergy Anaphylaxis   Fish Allergy Hives and Rash    "shrimp"    Medications Prior to Admission  Medication Sig Dispense Refill Last Dose/Taking   Blood Pressure Monitoring (BLOOD PRESSURE KIT) DEVI 1 Device by Does not apply route as needed. 1 each 0    Misc. Devices (GOJJI WEIGHT SCALE) MISC 1  Device by Does not apply route as needed. 1 each 0    Prenatal Vit-Fe Fumarate-FA (PRENATAL PLUS VITAMIN/MINERAL) 27-1 MG TABS Take 1 tablet by mouth daily. 30 tablet 11      Review of Systems  All systems reviewed and negative except as stated in HPI  Physical Exam BP 103/63 (BP Location: Right Arm)   Pulse 85   Temp 98 F (36.7 C) (Oral)   Resp 18   Ht 5\' 6"  (1.676 m)   Wt 56.2 kg   LMP 09/29/2022   SpO2 100%   BMI 19.98 kg/m   Physical Exam Vitals reviewed.  Constitutional:      General: She is not in acute distress.    Appearance: She is well-developed. She is not diaphoretic.  Cardiovascular:     Rate and Rhythm: Normal rate and regular rhythm.     Heart sounds: Normal heart sounds. No murmur heard. Pulmonary:     Effort: Pulmonary effort is normal. No respiratory distress.     Breath sounds: Normal breath sounds. No wheezing or rales.  Abdominal:     General: Bowel sounds are normal. There is no distension.     Palpations: Abdomen is soft.     Tenderness: There is no abdominal tenderness. There is no guarding or rebound.  Skin:    General: Skin is warm and dry.  Neurological:     Mental Status: She is alert.     Coordination: Coordination normal.     Presentation: cephalic by bedside US  Fetal monitoring: Baseline: 140 bpm, Variability: Good {> 6 bpm), Accelerations: absent, and Decelerations: Absent Uterine activity: regular q3-4 min     Prenatal labs: ABO, Rh: O/Positive/-- (01/20 1610) Antibody: Negative (01/20 0828) Rubella: 2.75 (01/20 0828) RPR: Non Reactive (01/20 0828)  HBsAg: Negative (01/20 0828)  HIV: Non Reactive (01/20 0828)  GC/Chlamydia:  Neisseria Gonorrhea  Date Value Ref Range Status  04/20/2023 Negative  Final   Chlamydia  Date Value Ref Range Status  04/20/2023 Positive (A)  Final   GBS:      Prenatal Transfer Tool  Maternal Diabetes: No Genetic Screening: Normal Maternal Ultrasounds/Referrals: Normal and Other: Fetal  Ultrasounds or other Referrals:  Other: polyhydramnios noted on serial exams Maternal Substance Abuse:  No Significant Maternal Medications:  None Significant Maternal Lab Results: Other: Chlamydia positive at new OB visit  No results found for this or any previous visit (from the past 24 hours).  Assessment: Tiffany Kline is a 18 y.o. G1P0000 at [redacted]w[redacted]d here for PROM.   #Labor: not in labor, confirmed vertex presentation on bedside US. Boarding in MAU for  now, cervical exam when she gets to L&D to determine how to augment labor.  #Pain: IV pain meds PRN, epidural upon request #FHT: Category I #GBS/ID: Unknown and Culture pending #MOF: breast feeding #MOC:  undecided #Circ: Yes  #Chlamydia infection:  positive swab on 04/20/2023, treatment sent 04/22/2023. Test of cure today.  #Anemia: Lab Results  Component Value Date   HGB 8.5 (L) 04/20/2023   Referred for IV iron but did not get any infusion, f/u admission CBC, consider TXA at delivery.   Venora Maples, MD/MPH Attending Family Medicine Physician, [facility] for [facility], [facility]  06/01/2023, 1:40 PM

## 2023-06-01 NOTE — Progress Notes (Addendum)
 Labor Progress Note Tiffany Kline is a 18 y.o. G1P0000 at [redacted]w[redacted]d presented for PPROM. Fern positive in clinic with large fluid noted.   S:  Coping well, feeling tightening associated with contractions traced on monitor.   O:  BP 103/63 (BP Location: Right Arm)   Pulse 85   Temp 98 F (36.7 C) (Oral)   Resp 18   Ht 5\' 6"  (1.676 m)   Wt 56.2 kg   LMP 09/29/2022   SpO2 100%   BMI 19.98 kg/m   EFM: baseline 140 bpm/ mod variability/ none accels/ variable & early decels  Toco/IUPC: 2-5 mins/mod SVE: Dilation: 5 Effacement (%): 80 Station: -2, -1 Presentation: Vertex Exam by:: Lamont Snowball, CNM Pitocin: 0 mu/min  A/P: 18 y.o. G1P0000 [redacted]w[redacted]d here s/p SROM large clear fluid with positive fern at home.   1. Labor: SROM, latent labor. Suspect progression to active phase imminently. RBA of induction methods discussed. Upon SCE 4/70-80/-3, with forebag noted. Offered AROM of forebag after RBA. Patient agrees to intervention, copious clear fluid noted. Fetal head well applied to cervix at end of AROM procedure.   2. FWB: Cat 2 3. Pain: Coping well, epidural on request 4. GBS unknown. PCR collected at time of SCE. Will treat and discontinue treatment with negative result. 5. CT TOC collected. D/t pos CT x2 in pregnancy with thick DC noted today will treat empirically pending TOC. Divided doses 250mg  x4 for total of 1000mg  to avoid NV.  6. RBA of BMZ discussed with patient, with discussion of possibility of not receiving second dose.  Patient ultimately opting to receive BMZ.     Anticipate NVSB.  Richardson Landry, CNM 3:14 PM

## 2023-06-01 NOTE — Anesthesia Preprocedure Evaluation (Addendum)
 Anesthesia Evaluation  Patient identified by MRN, date of birth, ID band Patient awake    Reviewed: Allergy & Precautions, NPO status , Patient's Chart, lab work & pertinent test results  History of Anesthesia Complications Negative for: history of anesthetic complications  Airway Mallampati: II  TM Distance: >3 FB Neck ROM: Full    Dental   Pulmonary neg pulmonary ROS   Pulmonary exam normal breath sounds clear to auscultation       Cardiovascular negative cardio ROS  Rhythm:Regular Rate:Normal     Neuro/Psych negative neurological ROS     GI/Hepatic negative GI ROS, Neg liver ROS,,,  Endo/Other  negative endocrine ROS    Renal/GU negative Renal ROS     Musculoskeletal   Abdominal   Peds  Hematology  (+) Blood dyscrasia, anemia Lab Results      Component                Value               Date                      WBC                      10.8 (H)            06/01/2023                HGB                      10.5 (L)            06/01/2023                HCT                      34.2 (L)            06/01/2023                MCV                      78.3 (L)            06/01/2023                PLT                      149 (L)             06/01/2023              Anesthesia Other Findings   Reproductive/Obstetrics (+) Pregnancy                             Anesthesia Physical Anesthesia Plan  ASA: 2  Anesthesia Plan: Epidural   Post-op Pain Management:    Induction:   PONV Risk Score and Plan:   Airway Management Planned: Natural Airway  Additional Equipment:   Intra-op Plan:   Post-operative Plan:   Informed Consent: I have reviewed the patients History and Physical, chart, labs and discussed the procedure including the risks, benefits and alternatives for the proposed anesthesia with the patient or authorized representative who has indicated his/her understanding and  acceptance.       Plan Discussed with: Anesthesiologist  Anesthesia Plan Comments: (I have discussed risks of neuraxial anesthesia including but not limited  to infection, bleeding, nerve injury, back pain, headache, seizures, and failure of block. Patient denies bleeding disorders and is not currently anticoagulated. Labs have been reviewed. Risks and benefits discussed. All patient's questions answered.  )       Anesthesia Quick Evaluation

## 2023-06-02 DIAGNOSIS — D62 Acute posthemorrhagic anemia: Secondary | ICD-10-CM | POA: Diagnosis not present

## 2023-06-02 LAB — CBC
HCT: 28.4 % — ABNORMAL LOW (ref 36.0–46.0)
Hemoglobin: 8.8 g/dL — ABNORMAL LOW (ref 12.0–15.0)
MCH: 24.4 pg — ABNORMAL LOW (ref 26.0–34.0)
MCHC: 31 g/dL (ref 30.0–36.0)
MCV: 78.7 fL — ABNORMAL LOW (ref 80.0–100.0)
Platelets: 153 10*3/uL (ref 150–400)
RBC: 3.61 MIL/uL — ABNORMAL LOW (ref 3.87–5.11)
RDW: 16.2 % — ABNORMAL HIGH (ref 11.5–15.5)
WBC: 18 10*3/uL — ABNORMAL HIGH (ref 4.0–10.5)
nRBC: 0 % (ref 0.0–0.2)

## 2023-06-02 LAB — RPR: RPR Ser Ql: NONREACTIVE

## 2023-06-02 LAB — CERVICOVAGINAL ANCILLARY ONLY
Chlamydia: POSITIVE — AB
Comment: NEGATIVE
Comment: NORMAL
Neisseria Gonorrhea: NEGATIVE

## 2023-06-02 MED ORDER — FERROUS SULFATE 325 (65 FE) MG PO TABS
325.0000 mg | ORAL_TABLET | ORAL | Status: DC
  Administered 2023-06-02: 325 mg via ORAL
  Filled 2023-06-02 (×2): qty 1

## 2023-06-02 NOTE — Progress Notes (Addendum)
 Daily Post Partum Note  06/02/2023 Tiffany Kline is a 18 y.o. G1P0101 PPD#1 s/p SVD/1st degree & right labial (repaired) & left labial (not repaired) at 34w3.  Pregnancy c/b PPROM with PTL and birth 24hr/overnight events:  none  Subjective:  Meeting all PP goals.   Objective:    Current Vital Signs 24h Vital Sign Ranges  T 98 F (36.7 C) Temp  Avg: 98.1 F (36.7 C)  Min: 97.6 F (36.4 C)  Max: 98.5 F (36.9 C)  BP 108/67 BP  Min: 99/64  Max: 176/156  HR 79 Pulse  Avg: 100.4  Min: 79  Max: 161  RR 17 Resp  Avg: 16.7  Min: 16  Max: 18  SaO2 100 % Room Air SpO2  Avg: 98.4 %  Min: 86 %  Max: 100 %       24 Hour I/O Current Shift I/O  Time Ins Outs 03/03 0701 - 03/04 0700 In: 224.9 [I.V.:224.9] Out: 322 [Urine:100] No intake/output data recorded.    General: NAD Abdomen: soft, nttp. Firm fundus below umbilicus. Perineum: deferred Skin:  Warm and dry.  Respiratory:  Normal respiratory effort  Medications Current Facility-Administered Medications  Medication Dose Route Frequency Provider Last Rate Last Admin   acetaminophen (TYLENOL) tablet 650 mg  650 mg Oral Q4H PRN Warren-Hill, Sara A, CNM       benzocaine-Menthol (DERMOPLAST) 20-0.5 % topical spray 1 Application  1 Application Topical PRN Warren-Hill, Sara A, CNM       coconut oil  1 Application Topical PRN Richardson Landry, CNM       witch hazel-glycerin (TUCKS) pad 1 Application  1 Application Topical PRN Warren-Hill, Clarita Crane, CNM       And   dibucaine (NUPERCAINAL) 1 % rectal ointment 1 Application  1 Application Rectal PRN Warren-Hill, Clarita Crane, CNM       diphenhydrAMINE (BENADRYL) capsule 25 mg  25 mg Oral Q6H PRN Warren-Hill, Sara A, CNM       ferrous sulfate tablet 325 mg  325 mg Oral Lottie Mussel, MD       ibuprofen (ADVIL) tablet 600 mg  600 mg Oral Q6H Warren-Hill, Sara A, CNM   600 mg at 06/02/23 0533   ondansetron (ZOFRAN) tablet 4 mg  4 mg Oral Q4H PRN Richardson Landry, CNM       Or    ondansetron (ZOFRAN) injection 4 mg  4 mg Intravenous Q4H PRN Warren-Hill, Sara A, CNM       prenatal multivitamin tablet 1 tablet  1 tablet Oral Q1200 Warren-Hill, Sara A, CNM       senna-docusate (Senokot-S) tablet 2 tablet  2 tablet Oral Daily Richardson Landry, CNM   2 tablet at 06/02/23 1610   simethicone (MYLICON) chewable tablet 80 mg  80 mg Oral PRN Warren-Hill, Sara A, CNM       Tdap (BOOSTRIX) injection 0.5 mL  0.5 mL Intramuscular Once Richardson Landry, CNM        Labs:  Recent Labs  Lab 06/01/23 1424 06/02/23 0408  WBC 10.8* 18.0*  HGB 10.5* 8.8*  HCT 34.2* 28.4*  PLT 149* 153   O POS RPR neg UCx, GC/CT pending Assessment & Plan:  Patient doing well *Postpartum: routine care. Breast. Boy (ask about circ tomorrow)/follow up re: birth control tomorrow. Chlamydia treated on admit *Acute blood loss anemia: asymptomatic. PO iron ordered *Dispo: likely tomorrow.   Cornelia Copa MD Attending Center for [facility])

## 2023-06-02 NOTE — Progress Notes (Signed)

## 2023-06-02 NOTE — Lactation Note (Signed)
 This note was copied from a baby's chart.  NICU Lactation Consultation Note  Patient Name: Tiffany Kline [place] Today's Date: 06/02/2023 Age:20 hours  Reason for consult: NICU baby; Late-preterm 34-36.6wks; Initial assessment; Other (Comment); Primapara; 1st time breastfeeding; Infant < 5lbs (teen pregnancy, LPNC)  SUBJECTIVE Visited with family of 53 34/70 weeks old AGA NICU female; [name] got admitted due to prematurity. Tiffany Kline is a P1 and her plan is to do both, direct breastfeeding along with pumping and bottle feeding once baby is ready. Assisted with hand expression, breast massage and fit her with pumping band in size S/M; she initiated pumping during [facility] consult, praised her for her efforts. Set up a washing/drying station for her pump parts and contacted NICU RN Tiffany Kline for breastmilk labels, they have already been provided to FOB. Reviewed pumping schedule, pumping log, lactogenesis II/III, CDC and anticipatory guidelines.   OBJECTIVE Infant data: Mother's Current Feeding Choice: Breast Milk and Donor Milk  O2 Device: Room Air  Infant feeding assessment IDFTS - Readiness: 3   Maternal data: G1P0101 Vaginal, Spontaneous Has patient been taught Hand Expression?: Yes Hand Expression Comments: several droplets of colostrum noted Significant Breast History:: (+) breast changes during the pregnancy Current breast feeding challenges:: NICU admission Does the patient have breastfeeding experience prior to this delivery?: No Pumping frequency: initiated pumping at 18 hours post-partum Pumped volume: 3 mL Flange Size: 21 (might need # 18 flanges) Hands-free pumping top sizes: Small/Medium (Blue) Risk factor for low/delayed milk supply:: primipara, prematurity, infant separation, < 5 lbs  Pump: Personal (Medela DEBP at home)  ASSESSMENT Infant: Feeding Status: Scheduled 9-12-3-6 Feeding method: Tube/Gavage (Bolus)  Maternal: Milk volume:  Normal  INTERVENTIONS/PLAN Interventions: Interventions: Breast feeding basics reviewed; Breast massage; Hand express; Coconut oil; DEBP; Education; CDC Guidelines for Breast Pump Cleaning; NICU Pumping Log; LC Services brochure Tools: Pump; Flanges; Coconut oil; Hands-free pumping top Pump Education: Setup, frequency, and cleaning; Milk Storage  Plan: STS whenever possible Massage and hand express both breasts prior/post pumping, coconut oil prior pumping Pump both breasts on initiate mode every 3 hours for 15 minutes  FOB present. All questions and concerns answered, family to contact [facility] services PRN.  Consult Status: NICU follow-up NICU Follow-up type: New admission follow up   Tiffany Kline S Tiffany Kline 06/02/2023, 12:26 PM

## 2023-06-03 LAB — URINE CULTURE: Organism ID, Bacteria: NO GROWTH

## 2023-06-03 LAB — SURGICAL PATHOLOGY

## 2023-06-03 NOTE — Plan of Care (Signed)
  Problem: Education: Goal: Knowledge of General Education information will improve Description: Including pain rating scale, medication(s)/side effects and non-pharmacologic comfort measures Outcome: Completed/Met   Problem: Health Behavior/Discharge Planning: Goal: Ability to manage health-related needs will improve Outcome: Completed/Met   Problem: Clinical Measurements: Goal: Ability to maintain clinical measurements within normal limits will improve Outcome: Completed/Met Goal: Will remain free from infection Outcome: Completed/Met Goal: Diagnostic test results will improve Outcome: Completed/Met Goal: Respiratory complications will improve Outcome: Completed/Met Goal: Cardiovascular complication will be avoided Outcome: Completed/Met   Problem: Activity: Goal: Risk for activity intolerance will decrease Outcome: Completed/Met   Problem: Nutrition: Goal: Adequate nutrition will be maintained Outcome: Completed/Met   Problem: Coping: Goal: Level of anxiety will decrease Outcome: Completed/Met   Problem: Elimination: Goal: Will not experience complications related to bowel motility Outcome: Completed/Met Goal: Will not experience complications related to urinary retention Outcome: Completed/Met   Problem: Pain Managment: Goal: General experience of comfort will improve and/or be controlled Outcome: Completed/Met   Problem: Safety: Goal: Ability to remain free from injury will improve Outcome: Completed/Met   Problem: Skin Integrity: Goal: Risk for impaired skin integrity will decrease Outcome: Completed/Met   Problem: Education: Goal: Knowledge of Childbirth will improve Outcome: Completed/Met Goal: Ability to make informed decisions regarding treatment and plan of care will improve Outcome: Completed/Met Goal: Ability to state and carry out methods to decrease the pain will improve Outcome: Completed/Met Goal: Individualized Educational Video(s) Outcome:  Completed/Met   Problem: Coping: Goal: Ability to verbalize concerns and feelings about labor and delivery will improve Outcome: Completed/Met   Problem: Life Cycle: Goal: Ability to make normal progression through stages of labor will improve Outcome: Completed/Met Goal: Ability to effectively push during vaginal delivery will improve Outcome: Completed/Met   Problem: Role Relationship: Goal: Will demonstrate positive interactions with the child Outcome: Completed/Met   Problem: Safety: Goal: Risk of complications during labor and delivery will decrease Outcome: Completed/Met   Problem: Pain Management: Goal: Relief or control of pain from uterine contractions will improve Outcome: Completed/Met   Problem: Education: Goal: Knowledge of condition will improve Outcome: Completed/Met Goal: Individualized Educational Video(s) Outcome: Completed/Met Goal: Individualized Newborn Educational Video(s) Outcome: Completed/Met   Problem: Activity: Goal: Will verbalize the importance of balancing activity with adequate rest periods Outcome: Completed/Met Goal: Ability to tolerate increased activity will improve Outcome: Completed/Met   Problem: Coping: Goal: Ability to identify and utilize available resources and services will improve Outcome: Completed/Met   Problem: Life Cycle: Goal: Chance of risk for complications during the postpartum period will decrease Outcome: Completed/Met   Problem: Role Relationship: Goal: Ability to demonstrate positive interaction with newborn will improve Outcome: Completed/Met   Problem: Skin Integrity: Goal: Demonstration of wound healing without infection will improve Outcome: Completed/Met

## 2023-06-03 NOTE — Lactation Note (Signed)
 This note was copied from a baby's chart.  NICU Lactation Consultation Note  Patient Name: Tiffany Kline [place] Today's Date: 06/03/2023 Age:20 hours  Reason for consult: Follow-up assessment; NICU baby; Primapara; 1st time breastfeeding; Infant < 6lbs; Late-preterm 34-36.6wks; Other (Comment); Maternal discharge (18 yr old, [facility])  SUBJECTIVE  LC in to visit with P1 Mom of [name] in the NICU.  Mom about to go to NICU and will be discharged later today.  Encouraged Mom to take her EBM to baby as she had several small colostrum bottles.  LC encouraged Mom to continue her consistent pumping to support a full milk supply.  Mom denies engorgement, breasts feeling heavier today.  Mom aware of lactation support and encouraged her to ask for help prn.  OBJECTIVE Infant data: Mother's Current Feeding Choice: Breast Milk and Donor Milk  O2 Device: Room Air  Infant feeding assessment IDFTS - Readiness: 3 (accepts paci)   Maternal data: G1P0101 Vaginal, Spontaneous Has patient been taught Hand Expression?: Yes Hand Expression Comments: several droplets of colostrum noted Significant Breast History:: (+) breast changes during the pregnancy Current breast feeding challenges:: NICU admission Does the patient have breastfeeding experience prior to this delivery?: No Pumping frequency: 8 times per 24 hrs Pumped volume: 15 mL Flange Size: 21 Hands-free pumping top sizes: Small/Medium (Blue) Risk factor for low/delayed milk supply:: primipara, prematurity, infant separation, < 5 lbs  WIC Referral Sent?: No Pump: Personal, DEBP  ASSESSMENT Infant:  Feeding Status: Scheduled 9-12-3-6 Feeding method: Tube/Gavage (Bolus)  Maternal: Milk volume: Normal  INTERVENTIONS/PLAN Interventions: Interventions: Breast feeding basics reviewed; Skin to skin; Breast massage; Hand express; DEBP; Education Discharge Education: Engorgement and breast care Tools: Pump; Flanges Pump Education:  Setup, frequency, and cleaning; Milk Storage  Plan: 1- STS with baby 2- massage breasts and hand express 3- Pump both breasts 8 times per 24 hrs 4- request LC prn  Consult Status: NICU follow-up NICU Follow-up type: Verify onset of copious milk; Verify absence of engorgement   [name] 06/03/2023, 12:16 PM

## 2023-06-05 ENCOUNTER — Ambulatory Visit: Payer: Medicaid Other

## 2023-06-08 ENCOUNTER — Encounter: Admitting: Physician Assistant

## 2023-06-09 ENCOUNTER — Ambulatory Visit (HOSPITAL_COMMUNITY): Payer: Self-pay

## 2023-06-09 ENCOUNTER — Telehealth (HOSPITAL_COMMUNITY): Payer: Self-pay

## 2023-06-09 NOTE — Telephone Encounter (Signed)
 06/09/2023 1610  Name: Tiffany Kline MRN: [id number] DOB: 14-Sep-2005  Reason for Call:  Transition of Care Hospital Discharge Call  Contact Status: Patient Contact Status: Message  Language assistant needed:          Follow-Up Questions:    Inocente Salles Postnatal Depression Scale:  In the Past 7 Days:    PHQ2-9 Depression Scale:     Discharge Follow-up:    Post-discharge interventions: NA  Signature  Signe Colt

## 2023-06-09 NOTE — Lactation Note (Signed)
 This note was copied from a baby's chart.  NICU Lactation Consultation Note  Patient Name: Tiffany Kline [place] Today's Date: 06/09/2023 Age:20 days  Reason for consult: Follow-up assessment; NICU baby; Primapara; Infant < 6lbs; Late-preterm 34-36.6wks; Other (Comment) (teen pregnancy, [facility], SLP request)  SUBJECTIVE Visited with family of 29 95/11 weeks old AGA NICU female; SLP Tiffany Kline asked this LC to check on Tiffany Kline because she didn't have a pump for  home use. She continues pumping for [name] but using a hand pump at home; she voiced she hasn't received her Medela DEBP yet. Noticed that pumping hasn't been consistent. Explained the importance of consistent pumping for the prevention of engorgement and to protect her supply. Provided 8 oz bottles and revised pump settings along with breastmilk storage guidelines. WIC referral faxed to the [facility] office at [facility] offered a [facility] loaner pump in the meantime; she'll let NICU RN know if she decides on doing the loaner today.  OBJECTIVE Infant data: Mother's Current Feeding Choice: Breast Milk  O2 Device: Room Air  Infant feeding assessment IDFTS - Readiness: 3   Maternal data: G1P0101 Vaginal, Spontaneous Pumping frequency: 3 times/24 hours Pumped volume: 90 mL  WIC Program: Yes WIC Referral Sent?: Yes What county?: [place] Pump:  (No pump at home as 06/09/23 he voiced her Medela pump has not arrived yet; offered [facility] loaner pump)  ASSESSMENT Infant: Latch: Too sleepy or reluctant, no latch achieved, no sucking elicited. Feeding Status: Scheduled 9-12-3-6 Feeding method: Tube/Gavage (Bolus)  Maternal: Milk volume: Normal  INTERVENTIONS/PLAN Interventions: Interventions: Breast feeding basics reviewed; DEBP; Education; Coconut oil  Plan: STS whenever possible Pump both breasts on maintain mode every 3 hours for 30 minutes Schedule appt at the [facility] office for pump issuance Call out for latch assistance when  she's ready to take [name] to breast   No other support person at this time. All questions and concerns answered, family to contact [facility] services PRN.  Consult Status: NICU follow-up NICU Follow-up type: Weekly NICU follow up; Verify DEBP issuance   Abrahan Fulmore S Therasa Lorenzi 06/09/2023, 11:08 AM

## 2023-06-10 ENCOUNTER — Ambulatory Visit (HOSPITAL_COMMUNITY): Payer: Self-pay

## 2023-06-10 NOTE — Lactation Note (Signed)
 This note was copied from a baby's chart.  NICU Lactation Consultation Note  Patient Name: Tiffany Kline [place] Today's Date: 06/10/2023 Age:20 days  Reason for consult: Follow-up assessment; Primapara; 1st time breastfeeding; Infant < 6lbs; Late-preterm 34-36.6wks; NICU baby; Other (Comment) (26 yr old, [facility], SLP request LC)  SUBJECTIVE  SLP requested lactation support.  Mom arrived and was needing to pump with the DEBP.  Mom using a hand pump at home.  Mom expressed 240 ml which was labeled and placed in frig.Vladimir Crofts provided Mom with a small pumping top and placed baby STS on Mom's chest.  LC reclined head of chair and baby being gavage fed.  Talked about feeding cues to look for.  LC hand expressed a drop of milk onto finger and placed on baby's mouth.  Baby turned towards the breast.  No licking or rooting to latch, but baby definitely is attracted to Mom's breast.    LC provided a washing and a drying bin, labeled them and washed the pump parts for Mom and set on clean cloth with clean paper towels.  Mom is using a hand pump at home, expressing 240 ml per Mom.  Mom states she hasn't received a call from [facility] yet.  [facility] emailed WIC again with contact information.  Mom also aware of [facility] loaner pump available to her.  LC told Mom that she would be here until 5:30 pm and would be happy to provide her with the pump for a $30 refundable deposit.  Mom till ask baby's nurse to call LC.    OBJECTIVE Infant data: Mother's Current Feeding Choice: Breast Milk  O2 Device: Room Air  Infant feeding assessment IDFTS - Readiness: 4   Maternal data: G1P0101 Vaginal, Spontaneous Pumping frequency: 6-8 times per 24 hrs, using a hand pump at home Pumped volume: 240 mL Flange Size: 21 Hands-free pumping top sizes: Small/Medium (Blue)  WIC Program: Yes WIC Referral Sent?: Yes (repeat referral sent) What county?: [place] Pump:  (offered a WIC loaner)  ASSESSMENT Infant: Latch: Too sleepy or  reluctant, no latch achieved, no sucking elicited. Audible Swallowing: None Type of Nipple: Everted at rest and after stimulation Comfort (Breast/Nipple): Soft / non-tender Hold (Positioning): Full assist, staff holds infant at breast LATCH Score: 4  Feeding Status: Scheduled 9-12-3-6 Feeding method: Breast  Maternal: Milk volume: Abundant  INTERVENTIONS/PLAN Interventions: Interventions: Breast feeding basics reviewed; Skin to skin; Breast massage; Hand express; Pre-pump if needed; Support pillows; Adjust position; Position options; DEBP; Hand pump; 8 oz. bottles; Education Tools: Pump; Flanges; Hands-free pumping top Pump Education: Setup, frequency, and cleaning; Milk Storage  Plan: 1- Continue to support milk supply with consistent pumping 2- STS with baby during gavage feedings, AFTER full pumping 3- watch baby for feeding readiness, cueing, licking and sucking on hand. 4-Ask for lactation support prn  Consult Status: NICU follow-up NICU Follow-up type: Weekly NICU follow up; Verify DEBP issuance   [name] 06/10/2023, 4:26 PM

## 2023-06-12 ENCOUNTER — Ambulatory Visit: Payer: Medicaid Other

## 2023-06-19 ENCOUNTER — Ambulatory Visit: Payer: Medicaid Other

## 2023-06-21 ENCOUNTER — Ambulatory Visit (HOSPITAL_COMMUNITY): Payer: Self-pay

## 2023-06-21 NOTE — Lactation Note (Signed)
 This note was copied from a baby's chart.  NICU Lactation Consultation Note  Patient Name: Tiffany Kline [place] Today's Date: 06/21/2023 Age:20 wk.o.  Reason for consult: Follow-up assessment; RN request; NICU baby; Early term 71-38.6wks; Infant < 6lbs; Primapara; 1st time breastfeeding  SUBJECTIVE  LC in to visit with P1 Mom of [name].  Baby is NPO for NEC.  RN concerned as Mom hasn't brought any milk in recently.  Mom states she is leaving EBM at home as baby has been NPO.  Encouraged her to pump consistently to maintain her milk supply.  Mom aware of [facility] loaner pump availability, but continues with her hand pump when at home.    Baby STS with Mom.  LC assisted Mom to pump while holding baby on her chest.  LC provided another pumping band and assisted with pumping.  Mom expressed 90 ml, which is a decrease from her usual amount. Mom states she has a [facility] appointment this week.  [facility] emailed [facility] coordinator to inquire about appointment and whether she can be seen soon.  OBJECTIVE Infant data: No data recorded O2 Device: Room Air  Infant feeding assessment IDFTS - Readiness: 2 (NPO)   Maternal data: G1P0101 Vaginal, Spontaneous Pumping frequency: 6-8 times per 24 hrs Pumped volume: 90 mL Flange Size: 21 Hands-free pumping top sizes: Small/Medium (Blue)  WIC Program: Yes WIC Referral Sent?: Yes (repeat referral sent) What county?: [place] Pump:  (offered a WIC loaner)  ASSESSMENT Infant:  Feeding Status: NPO  Maternal: Milk volume: Normal  INTERVENTIONS/PLAN Interventions: Tools: Pump; Flanges; Hands-free pumping top Pump Education: Setup, frequency, and cleaning; Milk Storage  Plan: 1- STS with baby during care times 2- Pump both breasts 8 times per 24 hrs 3- [facility] loaner pump available to $30 refundable deposit  No data recorded  [name] 06/21/2023, 5:45 PM

## 2023-07-02 ENCOUNTER — Ambulatory Visit (HOSPITAL_COMMUNITY): Payer: Self-pay

## 2023-07-02 NOTE — Lactation Note (Signed)
 This note was copied from a baby's chart.  NICU Lactation Consultation Note  Patient Name: Tiffany Kline [place] Today's Date: 07/02/2023 Age:20 wk.o.  Reason for consult: Weekly NICU follow-up; NICU baby; Early term 6-38.6wks; Primapara; 1st time breastfeeding; Other (Comment) (teen pregnancy, [facility]NC)  SUBJECTIVE Visited with family of 51 90/74 weeks old AGA NICU female; Ms. Nauman is a P1 and reported that she's still using her hand pump but it's not consistently, her supply has decreased. She voiced that she couldn't attend to her appt at the [facility] office and got it rescheduled, her new appt is now on 82/95/62 to re-certify for the program and get her DEBP. She's bringing some breastmilk, baby "[name]" is currently on breastmilk but mostly Similac 22 calorie formula. Explained that her supply will continue to dwindle if she's not pumping the # of times that baby is eating, and that any amount of EBM will still be beneficial for him considering his Hx of stage IIA medical NEC. She's excited to take baby home this week as he was put on ad lib trial. Let her know that we will still support her feeding choice even if she decides to stay with formula only.   OBJECTIVE Infant data: Mother's Current Feeding Choice: Breast Milk and Formula  O2 Device: Room Air  Infant feeding assessment IDFTS - Readiness: 1 IDFTS - Quality: 2   Maternal data: G1P0101 Vaginal, Spontaneous Pumping frequency: 3 times/24 hours Pumped volume: 5 mL (5-10 ml)  WIC Program: Yes WIC Referral Sent?: Yes (repeat referral sent) What county?: [place] Pump: Manual (declined loaner pump)  ASSESSMENT Infant: Feeding Status: Ad lib Feeding method: Bottle Nipple Type: Dr. Levert Feinstein Preemie  Maternal: Milk volume: Low  INTERVENTIONS/PLAN Interventions: Interventions: Breast feeding basics reviewed; DEBP; Education  Plan: STS whenever possible Pump both breasts on maintain mode every 3 hours for 30 minutes;  ideally 8 pumping sessions/24 hours Attend to her [facility] Appt  for pump issuance on 07/07/23 Call for latch assistance if she decides to take baby "[name]" to breast   No other support person at this time. All questions and concerns answered, family to contact [facility] services PRN.  Consult Status: NICU follow-up NICU Follow-up type: Baby's discharge   Devi Hopman Venetia Constable 07/02/2023, 9:35 AM

## 2023-07-03 ENCOUNTER — Ambulatory Visit (HOSPITAL_COMMUNITY): Payer: Self-pay

## 2023-07-03 NOTE — Lactation Note (Signed)
 This note was copied from a baby's chart.  NICU Lactation Consultation Note  Patient Name: Tiffany Kline Tiffany Kline Today's Date: 07/03/2023 Age:20 wk.o.  Reason for consult: Weekly NICU follow-up; NICU baby; Term; Primapara; 1st time breastfeeding; Other (Comment) (teen pregnancy, LPNC)  SUBJECTIVE Visited with family of 29 44/63 weeks old AGA NICU female; Ms. Kempen reported no changes in her pumping schedule/frequency but had some questions about transitioning to formula, [name] has been on donor milk during his NICU stay. She's taking [name] home today. Reviewed discharge education and the importance of consistent pumping to protect her supply, she's still planning on picking up her pump from the [facility] office on 07/07/23. She has some breastmilk stored at home and is planning on using it and wanted to know if baby would tolerate the "mixed feedings" of Similac 22 calorie formula along some breastmilk. Let her know that if baby has been tolerating formula for the last 24-48 hours without any adverse effects, the likelihood that he won't tolerate it later is very low. She understands that breastmilk (her own or donor) is much easier to digest than formula and shouldn't be a problem. Any amount of EBM is still going to be beneficial for baby. She politely declined a referral to [facility] OP but has their contact info in case she needs to reach out. No other support person at this time. All questions and concerns answered, family to contact [facility] services PRN.  OBJECTIVE Infant data: Mother's Current Feeding Choice: Breast Milk and Formula  O2 Device: Room Air  Infant feeding assessment IDFTS - Readiness: 1 IDFTS - Quality: 2   Maternal data: G1P0101 Vaginal, Spontaneous Pumping frequency: 3 times/24 hours Pumped volume: 5 mL (5-10 ml)  WIC Program: Yes WIC Referral Sent?: Yes (repeat referral sent) What county?: [place] Pump: Manual (offered loaner pump on  07/03/2023)  ASSESSMENT Infant: Feeding Status: Ad lib Feeding method: Bottle Nipple Type: Dr. Levert Feinstein Preemie  Maternal: Milk volume: Low  INTERVENTIONS/PLAN Interventions: Interventions: Breast feeding basics reviewed; DEBP; Education; Hand pump  Plan: Consult Status: Complete   [name] S [name] 07/03/2023, 10:58 AM

## 2023-07-07 ENCOUNTER — Other Ambulatory Visit: Payer: Self-pay

## 2023-07-07 ENCOUNTER — Ambulatory Visit (INDEPENDENT_AMBULATORY_CARE_PROVIDER_SITE_OTHER): Admitting: Certified Nurse Midwife

## 2023-07-07 ENCOUNTER — Encounter: Payer: Self-pay | Admitting: Certified Nurse Midwife

## 2023-07-07 DIAGNOSIS — Z30011 Encounter for initial prescription of contraceptive pills: Secondary | ICD-10-CM

## 2023-07-07 DIAGNOSIS — Z3009 Encounter for other general counseling and advice on contraception: Secondary | ICD-10-CM

## 2023-07-07 MED ORDER — NORETHIN ACE-ETH ESTRAD-FE 1-20 MG-MCG PO TABS: 1.0000 | ORAL_TABLET | Freq: Every day | ORAL | 11 refills | Status: AC

## 2023-07-07 NOTE — Progress Notes (Unsigned)
 Post Partum Visit Note  Tiffany Kline is a 18 y.o. G20P0101 female who presents for a postpartum visit. She is 5 weeks postpartum following a normal spontaneous vaginal delivery.  I have fully reviewed the prenatal and intrapartum course. The delivery was at [redacted]w[redacted]d gestational weeks.  Anesthesia: epidural. Postpartum course has been benign, without complaint. Baby is doing well. Baby is feeding by bottle - Similac Neosure. Bleeding no bleeding. Bowel function is normal. Bladder function is normal. Patient is not sexually active. Contraception method is OCP (estrogen/progesterone). Postpartum depression screening: negative.  The patient reports occasional "random" sharp pains in her vaginal area. Reports she has not noticed a pattern, has not yet resumed sex, is not associated with urination or movement.    Patient initially reported she had stopped bleeding but started again while at office.    Upstream - 07/07/23 1646       Pregnancy Intention Screening   Does the patient want to become pregnant in the next year? No    Does the patient's partner want to become pregnant in the next year? No    Would the patient like to discuss contraceptive options today? No      Contraception Wrap Up   Current Method No Method - Other Reason    End Method No Contraception Precautions    Contraception Counseling Provided No    How was the end contraceptive method provided? N/A            The pregnancy intention screening data noted above was reviewed. Potential methods of contraception were discussed. The patient elected to proceed with No Contraception Precautions.   Edinburgh Postnatal Depression Scale - 07/07/23 1646       Edinburgh Postnatal Depression Scale:  In the Past 7 Days   I have been able to laugh and see the funny side of things. 0    I have looked forward with enjoyment to things. 0    I have blamed myself unnecessarily when things went wrong. 0    I have been anxious or  worried for no good reason. 0    I have felt scared or panicky for no good reason. 0    Things have been getting on top of me. 0    I have been so unhappy that I have had difficulty sleeping. 0    I have felt sad or miserable. 0    I have been so unhappy that I have been crying. 0    The thought of harming myself has occurred to me. 0    Edinburgh Postnatal Depression Scale Total 0             Health Maintenance Due  Topic Date Due   HPV VACCINES (1 - 3-dose series) Never done   COVID-19 Vaccine (1 - 2024-25 season) Never done    The following portions of the patient's history were reviewed and updated as appropriate: allergies, current medications, past family history, past medical history, past social history, past surgical history, and problem list.  Review of Systems Pertinent items are noted in HPI.  Objective:  BP 110/74   Pulse 71   Wt 104 lb (47.2 kg)   LMP 09/29/2022   Breastfeeding No   BMI 16.79 kg/m    General:  alert, cooperative, and appears stated age   Breasts:  normal  Lungs: clear to auscultation bilaterally  Heart:  regular rate and rhythm, S1, S2 normal, no murmur, click, rub or gallop  Abdomen:  soft, non-tender; bowel sounds normal; no masses,  no organomegaly   Wound NA  GU exam:   Inspection of external genitalia revealed small amount of blood . A suture was visualized on the labia and was easily removed digitally. Patient was able to localize her pain to this area.        Assessment:   Postpartum care and examination - Overall benign examination of non-lactating postpartum patient. - Removal of remaining suture with patient expressing relief of discomfort.   Advised about oral contraception - Patient initially undecided regarding which OCP she desired.  Counseled on POPs vs. COCs. Patient > 21 days PP, without significant risk for VTE and non-breastfeeding, making her a candidate for COCs.  This is her preference.  Encounter for initial  prescription of contraceptive pills  - Plan: norethindrone-ethinyl estradiol-FE (LOESTRIN FE) 1-20 MG-MCG tablet, sent to patient's preferred pharmacy.    Benign, well postpartum exam.   Plan:   Essential components of care per ACOG recommendations:  1.  Mood and well being: Patient with negative depression screening today. Reviewed local resources for support.  - Patient tobacco use? No.   - hx of drug use? No.    2. Infant care and feeding:  -Patient currently breastmilk feeding? No.  -Social determinants of health (SDOH) reviewed in EPIC. No concerns.  3. Sexuality, contraception and birth spacing - Patient does not want a pregnancy in the next year.  Desired family size is undecided number children.  - Reviewed reproductive life planning. Reviewed contraceptive methods based on pt preferences and effectiveness.  Patient desired Oral Contraceptive today.   - Discussed birth spacing of 18 months  4. Sleep and fatigue -Encouraged family/partner/community support of 4 hrs of uninterrupted sleep to help with mood and fatigue  5. Physical Recovery  - Discussed patients delivery and complications. She describes her labor as good. - Patient had a Vaginal, no problems at delivery. Patient had a 1st degree laceration. Perineal healing reviewed. Patient expressed understanding - Patient has urinary incontinence? No. - Patient is safe to resume physical and sexual activity  6.  Health Maintenance - HM due items addressed Yes - Last pap smear No results found for: "DIAGPAP" Pap smear not done at today's visit. Not indicated due patient age.  -Breast Cancer screening indicated? No.   7. Chronic Disease/Pregnancy Condition follow up: None  - PCP follow up  Richardson Landry, CNM [facility] for [employer], [facility]

## 2023-07-11 DIAGNOSIS — Z419 Encounter for procedure for purposes other than remedying health state, unspecified: Secondary | ICD-10-CM | POA: Diagnosis not present

## 2023-08-10 DIAGNOSIS — Z419 Encounter for procedure for purposes other than remedying health state, unspecified: Secondary | ICD-10-CM | POA: Diagnosis not present

## 2023-09-10 DIAGNOSIS — Z419 Encounter for procedure for purposes other than remedying health state, unspecified: Secondary | ICD-10-CM | POA: Diagnosis not present

## 2023-10-10 DIAGNOSIS — Z419 Encounter for procedure for purposes other than remedying health state, unspecified: Secondary | ICD-10-CM | POA: Diagnosis not present

## 2023-11-10 DIAGNOSIS — Z419 Encounter for procedure for purposes other than remedying health state, unspecified: Secondary | ICD-10-CM | POA: Diagnosis not present

## 2023-12-11 DIAGNOSIS — Z419 Encounter for procedure for purposes other than remedying health state, unspecified: Secondary | ICD-10-CM | POA: Diagnosis not present

## 2024-02-10 DIAGNOSIS — Z419 Encounter for procedure for purposes other than remedying health state, unspecified: Secondary | ICD-10-CM | POA: Diagnosis not present
